# Patient Record
Sex: Female | Born: 1940
Health system: Southern US, Community
[De-identification: ages and names within clinical notes are randomized; demographics above are authoritative.]

## PROBLEM LIST (undated history)

## (undated) DIAGNOSIS — I10 Essential (primary) hypertension: Secondary | ICD-10-CM

## (undated) DIAGNOSIS — D649 Anemia, unspecified: Secondary | ICD-10-CM

## (undated) DIAGNOSIS — K219 Gastro-esophageal reflux disease without esophagitis: Secondary | ICD-10-CM

## (undated) DIAGNOSIS — E785 Hyperlipidemia, unspecified: Secondary | ICD-10-CM

## (undated) DIAGNOSIS — S80819A Abrasion, unspecified lower leg, initial encounter: Secondary | ICD-10-CM

## (undated) DIAGNOSIS — M1990 Unspecified osteoarthritis, unspecified site: Secondary | ICD-10-CM

## (undated) DIAGNOSIS — R0602 Shortness of breath: Secondary | ICD-10-CM

## (undated) HISTORY — PX: TUBAL LIGATION: SHX77

## (undated) HISTORY — PX: TONSILLECTOMY: SUR1361

## (undated) HISTORY — DX: Abrasion, unspecified lower leg, initial encounter: S80.819A

## (undated) HISTORY — PX: CATARACT EXTRACTION: SUR2

## (undated) HISTORY — DX: Essential (primary) hypertension: I10

## (undated) HISTORY — DX: Hyperlipidemia, unspecified: E78.5

## (undated) HISTORY — PX: BIOPSY BREAST: PRO8

## (undated) HISTORY — DX: Anemia, unspecified: D64.9

---

## 2002-11-26 ENCOUNTER — Encounter: Payer: Self-pay | Admitting: Family Medicine

## 2002-11-26 ENCOUNTER — Encounter: Admission: RE | Admit: 2002-11-26 | Discharge: 2002-11-26 | Payer: Self-pay | Admitting: Family Medicine

## 2002-12-07 ENCOUNTER — Other Ambulatory Visit: Admission: RE | Admit: 2002-12-07 | Discharge: 2002-12-07 | Payer: Self-pay | Admitting: Family Medicine

## 2002-12-29 ENCOUNTER — Encounter: Admission: RE | Admit: 2002-12-29 | Discharge: 2002-12-29 | Payer: Self-pay | Admitting: Family Medicine

## 2002-12-29 ENCOUNTER — Encounter: Payer: Self-pay | Admitting: Family Medicine

## 2007-10-09 ENCOUNTER — Other Ambulatory Visit: Admission: RE | Admit: 2007-10-09 | Discharge: 2007-10-09 | Payer: Self-pay | Admitting: Internal Medicine

## 2009-10-31 ENCOUNTER — Encounter: Admission: RE | Admit: 2009-10-31 | Discharge: 2009-10-31 | Payer: Self-pay | Admitting: Internal Medicine

## 2010-02-03 ENCOUNTER — Encounter: Admission: RE | Admit: 2010-02-03 | Discharge: 2010-02-03 | Payer: Self-pay | Admitting: Internal Medicine

## 2011-01-08 ENCOUNTER — Other Ambulatory Visit: Payer: Self-pay | Admitting: Internal Medicine

## 2011-01-08 DIAGNOSIS — Z1231 Encounter for screening mammogram for malignant neoplasm of breast: Secondary | ICD-10-CM

## 2011-02-09 ENCOUNTER — Ambulatory Visit
Admission: RE | Admit: 2011-02-09 | Discharge: 2011-02-09 | Disposition: A | Discharge status: Home / Self Care | Payer: Medicare Other | Source: Ambulatory Visit | Attending: Internal Medicine | Admitting: Internal Medicine

## 2011-02-09 DIAGNOSIS — Z1231 Encounter for screening mammogram for malignant neoplasm of breast: Secondary | ICD-10-CM

## 2011-04-30 DIAGNOSIS — H35369 Drusen (degenerative) of macula, unspecified eye: Secondary | ICD-10-CM | POA: Diagnosis not present

## 2011-04-30 DIAGNOSIS — D3100 Benign neoplasm of unspecified conjunctiva: Secondary | ICD-10-CM | POA: Diagnosis not present

## 2011-04-30 DIAGNOSIS — H524 Presbyopia: Secondary | ICD-10-CM | POA: Diagnosis not present

## 2011-04-30 DIAGNOSIS — H2510 Age-related nuclear cataract, unspecified eye: Secondary | ICD-10-CM | POA: Diagnosis not present

## 2011-05-15 DIAGNOSIS — Z23 Encounter for immunization: Secondary | ICD-10-CM | POA: Diagnosis not present

## 2011-05-15 DIAGNOSIS — R42 Dizziness and giddiness: Secondary | ICD-10-CM | POA: Diagnosis not present

## 2011-11-15 DIAGNOSIS — Z0000 Encounter for general adult medical examination without abnormal findings: Secondary | ICD-10-CM | POA: Diagnosis not present

## 2011-11-15 DIAGNOSIS — E559 Vitamin D deficiency, unspecified: Secondary | ICD-10-CM | POA: Diagnosis not present

## 2011-11-19 DIAGNOSIS — M5380 Other specified dorsopathies, site unspecified: Secondary | ICD-10-CM | POA: Diagnosis not present

## 2011-11-20 DIAGNOSIS — R5383 Other fatigue: Secondary | ICD-10-CM | POA: Diagnosis not present

## 2011-11-20 DIAGNOSIS — E559 Vitamin D deficiency, unspecified: Secondary | ICD-10-CM | POA: Diagnosis not present

## 2011-11-20 DIAGNOSIS — E789 Disorder of lipoprotein metabolism, unspecified: Secondary | ICD-10-CM | POA: Diagnosis not present

## 2011-11-20 DIAGNOSIS — R5381 Other malaise: Secondary | ICD-10-CM | POA: Diagnosis not present

## 2012-01-22 ENCOUNTER — Other Ambulatory Visit: Payer: Self-pay | Admitting: Internal Medicine

## 2012-01-22 DIAGNOSIS — Z1231 Encounter for screening mammogram for malignant neoplasm of breast: Secondary | ICD-10-CM

## 2012-02-19 ENCOUNTER — Ambulatory Visit
Admission: RE | Admit: 2012-02-19 | Discharge: 2012-02-19 | Disposition: A | Discharge status: Home / Self Care | Payer: Medicare Other | Source: Ambulatory Visit | Attending: Internal Medicine | Admitting: Internal Medicine

## 2012-02-19 DIAGNOSIS — Z1231 Encounter for screening mammogram for malignant neoplasm of breast: Secondary | ICD-10-CM

## 2012-02-28 DIAGNOSIS — Z23 Encounter for immunization: Secondary | ICD-10-CM | POA: Diagnosis not present

## 2012-04-29 DIAGNOSIS — Z87898 Personal history of other specified conditions: Secondary | ICD-10-CM | POA: Diagnosis not present

## 2012-04-29 DIAGNOSIS — J069 Acute upper respiratory infection, unspecified: Secondary | ICD-10-CM | POA: Diagnosis not present

## 2012-04-30 DIAGNOSIS — H5200 Hypermetropia, unspecified eye: Secondary | ICD-10-CM | POA: Diagnosis not present

## 2012-04-30 DIAGNOSIS — H43399 Other vitreous opacities, unspecified eye: Secondary | ICD-10-CM | POA: Diagnosis not present

## 2012-04-30 DIAGNOSIS — H524 Presbyopia: Secondary | ICD-10-CM | POA: Diagnosis not present

## 2012-04-30 DIAGNOSIS — H2510 Age-related nuclear cataract, unspecified eye: Secondary | ICD-10-CM | POA: Diagnosis not present

## 2012-07-09 DIAGNOSIS — K625 Hemorrhage of anus and rectum: Secondary | ICD-10-CM | POA: Diagnosis not present

## 2012-11-11 DIAGNOSIS — Z0000 Encounter for general adult medical examination without abnormal findings: Secondary | ICD-10-CM | POA: Diagnosis not present

## 2012-11-11 DIAGNOSIS — E789 Disorder of lipoprotein metabolism, unspecified: Secondary | ICD-10-CM | POA: Diagnosis not present

## 2012-11-11 DIAGNOSIS — R5381 Other malaise: Secondary | ICD-10-CM | POA: Diagnosis not present

## 2012-11-11 DIAGNOSIS — E559 Vitamin D deficiency, unspecified: Secondary | ICD-10-CM | POA: Diagnosis not present

## 2012-11-17 DIAGNOSIS — Z0000 Encounter for general adult medical examination without abnormal findings: Secondary | ICD-10-CM | POA: Diagnosis not present

## 2012-11-17 DIAGNOSIS — E559 Vitamin D deficiency, unspecified: Secondary | ICD-10-CM | POA: Diagnosis not present

## 2012-11-17 DIAGNOSIS — R7309 Other abnormal glucose: Secondary | ICD-10-CM | POA: Diagnosis not present

## 2012-11-17 DIAGNOSIS — K649 Unspecified hemorrhoids: Secondary | ICD-10-CM | POA: Diagnosis not present

## 2013-01-14 ENCOUNTER — Other Ambulatory Visit: Payer: Self-pay

## 2013-01-14 DIAGNOSIS — Z1231 Encounter for screening mammogram for malignant neoplasm of breast: Secondary | ICD-10-CM

## 2013-02-27 ENCOUNTER — Ambulatory Visit: Payer: Medicare Other

## 2013-04-28 ENCOUNTER — Ambulatory Visit
Admission: RE | Admit: 2013-04-28 | Discharge: 2013-04-28 | Disposition: A | Discharge status: Home / Self Care | Payer: Medicare Other | Source: Ambulatory Visit

## 2013-04-28 DIAGNOSIS — Z1231 Encounter for screening mammogram for malignant neoplasm of breast: Secondary | ICD-10-CM | POA: Diagnosis not present

## 2013-05-14 DIAGNOSIS — E559 Vitamin D deficiency, unspecified: Secondary | ICD-10-CM | POA: Diagnosis not present

## 2013-05-14 DIAGNOSIS — R7309 Other abnormal glucose: Secondary | ICD-10-CM | POA: Diagnosis not present

## 2013-05-14 DIAGNOSIS — E7800 Pure hypercholesterolemia, unspecified: Secondary | ICD-10-CM | POA: Diagnosis not present

## 2013-05-20 DIAGNOSIS — E785 Hyperlipidemia, unspecified: Secondary | ICD-10-CM | POA: Diagnosis not present

## 2013-05-20 DIAGNOSIS — R7989 Other specified abnormal findings of blood chemistry: Secondary | ICD-10-CM | POA: Diagnosis not present

## 2013-05-20 DIAGNOSIS — R7309 Other abnormal glucose: Secondary | ICD-10-CM | POA: Diagnosis not present

## 2013-05-20 DIAGNOSIS — E559 Vitamin D deficiency, unspecified: Secondary | ICD-10-CM | POA: Diagnosis not present

## 2013-08-20 DIAGNOSIS — H35369 Drusen (degenerative) of macula, unspecified eye: Secondary | ICD-10-CM | POA: Diagnosis not present

## 2013-08-20 DIAGNOSIS — H2510 Age-related nuclear cataract, unspecified eye: Secondary | ICD-10-CM | POA: Diagnosis not present

## 2013-08-20 DIAGNOSIS — D3100 Benign neoplasm of unspecified conjunctiva: Secondary | ICD-10-CM | POA: Diagnosis not present

## 2013-08-20 DIAGNOSIS — H524 Presbyopia: Secondary | ICD-10-CM | POA: Diagnosis not present

## 2013-09-24 DIAGNOSIS — M76899 Other specified enthesopathies of unspecified lower limb, excluding foot: Secondary | ICD-10-CM | POA: Diagnosis not present

## 2013-09-28 DIAGNOSIS — M47817 Spondylosis without myelopathy or radiculopathy, lumbosacral region: Secondary | ICD-10-CM | POA: Diagnosis not present

## 2013-09-28 DIAGNOSIS — M545 Low back pain, unspecified: Secondary | ICD-10-CM | POA: Diagnosis not present

## 2013-09-28 DIAGNOSIS — M4310 Spondylolisthesis, site unspecified: Secondary | ICD-10-CM | POA: Diagnosis not present

## 2013-09-28 DIAGNOSIS — M549 Dorsalgia, unspecified: Secondary | ICD-10-CM | POA: Diagnosis not present

## 2013-10-06 ENCOUNTER — Other Ambulatory Visit: Payer: Self-pay | Admitting: Internal Medicine

## 2013-10-06 DIAGNOSIS — M549 Dorsalgia, unspecified: Secondary | ICD-10-CM

## 2013-10-15 ENCOUNTER — Ambulatory Visit
Admission: RE | Admit: 2013-10-15 | Discharge: 2013-10-15 | Disposition: A | Discharge status: Home / Self Care | Payer: Medicare Other | Source: Ambulatory Visit | Attending: Internal Medicine | Admitting: Internal Medicine

## 2013-10-15 DIAGNOSIS — M48061 Spinal stenosis, lumbar region without neurogenic claudication: Secondary | ICD-10-CM | POA: Diagnosis not present

## 2013-10-15 DIAGNOSIS — M5137 Other intervertebral disc degeneration, lumbosacral region: Secondary | ICD-10-CM | POA: Diagnosis not present

## 2013-10-15 DIAGNOSIS — M4310 Spondylolisthesis, site unspecified: Secondary | ICD-10-CM | POA: Diagnosis not present

## 2013-10-15 DIAGNOSIS — M549 Dorsalgia, unspecified: Secondary | ICD-10-CM

## 2013-10-15 DIAGNOSIS — M47817 Spondylosis without myelopathy or radiculopathy, lumbosacral region: Secondary | ICD-10-CM | POA: Diagnosis not present

## 2013-10-19 ENCOUNTER — Other Ambulatory Visit: Payer: Self-pay | Admitting: Internal Medicine

## 2013-10-19 DIAGNOSIS — R9389 Abnormal findings on diagnostic imaging of other specified body structures: Secondary | ICD-10-CM

## 2013-10-20 ENCOUNTER — Ambulatory Visit
Admission: RE | Admit: 2013-10-20 | Discharge: 2013-10-20 | Disposition: A | Discharge status: Home / Self Care | Payer: Medicare Other | Source: Ambulatory Visit | Attending: Internal Medicine | Admitting: Internal Medicine

## 2013-10-20 DIAGNOSIS — R9389 Abnormal findings on diagnostic imaging of other specified body structures: Secondary | ICD-10-CM

## 2013-10-20 DIAGNOSIS — N859 Noninflammatory disorder of uterus, unspecified: Secondary | ICD-10-CM | POA: Diagnosis not present

## 2013-11-02 ENCOUNTER — Ambulatory Visit (INDEPENDENT_AMBULATORY_CARE_PROVIDER_SITE_OTHER): Payer: Medicare Other | Admitting: Obstetrics & Gynecology

## 2013-11-02 ENCOUNTER — Encounter: Payer: Self-pay | Admitting: Obstetrics & Gynecology

## 2013-11-02 VITALS — BP 140/82 | HR 64 | Resp 16 | Ht 66.0 in | Wt 258.4 lb

## 2013-11-02 DIAGNOSIS — R9389 Abnormal findings on diagnostic imaging of other specified body structures: Secondary | ICD-10-CM

## 2013-11-02 DIAGNOSIS — Z124 Encounter for screening for malignant neoplasm of cervix: Secondary | ICD-10-CM | POA: Diagnosis not present

## 2013-11-02 DIAGNOSIS — D261 Other benign neoplasm of corpus uteri: Secondary | ICD-10-CM | POA: Diagnosis not present

## 2013-11-02 NOTE — Progress Notes (Signed)
Sonohysterogram U/S scheduled and patient aware/agreeable to time.  Patient verbalized understanding of the U/S appointment cancellation policy. Advised will need to cancel within 72 business hours (3 business days) or will have $150.00 no show fee placed to account.Given printed appointment instructions and [employer] pamphlet on ultrasounds.

## 2013-11-02 NOTE — Progress Notes (Signed)
Patient ID: Christine Munoz, female   DOB: 1940-10-03, 73 y.o.   MRN: [id number]  73 y.o. G1P1 SingleAfrican AmericanF here for new patient visit.  Referred from Dr. Minna Antis.  Pt having knee issues and low back issues.  Had MRI of spine done 10/15/13 showing abnormal appearing endometrium.  PUS done 10/20/13 showing 2.5cm thickened endometrium.  Pt denied vaginal bleeding or pelvic pain.  She has not seen a gynecologist in several years.  Unsure of last Pap smear.  Very nervous today.    No LMP recorded. Patient is postmenopausal.          Sexually active: No.  The current method of family planning is tubal ligation.    Exercising: No.  due to current low back pain Smoker:  no  Health Maintenance: Pap:  unsure History of abnormal Pap:  no MMG:  1/15   reports that she has quit smoking. She has never used smokeless tobacco. She reports that she drinks alcohol. She reports that she does not use illicit drugs.  Past Medical History  Diagnosis Date  . Hyperlipidemia     borderline  . Anemia   . Leg abrasion     had to go to the wound center-? diag    Past Surgical History  Procedure Laterality Date  . Tubal ligation    . Tonsillectomy      Current Outpatient Prescriptions  Medication Sig Dispense Refill  . aspirin 81 MG tablet Take 81 mg by mouth daily.      Marland Kitchen CALCIUM PO Take by mouth daily.      . Cholecalciferol (VITAMIN D PO) Take 4,000 Int'l Units by mouth.      . Omega-3 Fatty Acids (FISH OIL PO) Take by mouth daily.      . traMADol (ULTRAM) 50 MG tablet Take by mouth 2 (two) times daily.       No current facility-administered medications for this visit.    Family History  Problem Relation Age of Onset  . Liver cancer Sister   . Brain cancer Father     tumor  . Arthritis Mother   . Colon cancer Maternal Uncle   . Leukemia Maternal Uncle     ROS:  Pertinent items are noted in HPI.  Otherwise, a comprehensive ROS was negative.  Exam:   BP 140/82  Pulse 64  Resp 16   Ht 5\' 6"  (1.676 m)  Wt 258 lb 6.4 oz (117.209 kg)  BMI 41.73 kg/m2   Height: 5\' 6"  (167.6 cm)  Ht Readings from Last 3 Encounters:  11/02/13 5\' 6"  (1.676 m)    General appearance: alert, cooperative and appears stated age Head: Normocephalic, without obvious abnormality, atraumatic Lungs: clear bilaterally Breasts: normal appearance, no masses or tenderness Heart: regular rate and rhythm Abdomen: soft, non-tender; bowel sounds normal; no masses,  no organomegaly Extremities: extremities normal, atraumatic, no cyanosis or edema Skin: Skin color, texture, turgor normal. No rashes or lesions Lymph nodes: Cervical, supraclavicular, and axillary nodes normal. No abnormal inguinal nodes palpated Neurologic: Grossly normal   Pelvic: External genitalia:  no lesions              Urethra:  normal appearing urethra with no masses, tenderness or lesions              Bartholins and Skenes: normal                 Vagina: normal appearing vagina with normal color and discharge, no lesions  Cervix: no lesions              Pap taken: Yes.   Bimanual Exam:  Uterus:  normal size, contour, position, consistency, mobility, non-tender              Adnexa: normal adnexa and no mass, fullness, tenderness               Rectovaginal: Confirms               Anus:  normal sphincter tone, no lesions  Endometrial biopsy recommended.  Discussed with patient.  Verbal and written consent obtained.   Procedure:  Speculum placed.  Cervix visualized and cleansed with betadine prep.  A single toothed tenaculum was applied to the anterior lip of the cervix.  Dilation of cervix was necessary with milex dilator.  Endometrial pipelle was advanced through the cervix into the endometrial cavity without difficulty.  Pipelle passed to 7.5cm.  Suction applied and pipelle removed with scant tissue specimen.  Second pass was performed with more tissue present.  Still, tissue appears atrohpic to visual inspection.   Tenculum removed.  No bleeding noted.  Patient tolerated procedure well.  All instruments removed. 78588 A:  Thickened endometrium Obese AAF Elevated lipids Left leg pain, improved after steroid taper  P:   Endometrial biopsy pending.  Doubtful there is anything abnormal here.  Probably polyp causing finding.  Will proceed with sonohysterography.  If polyp or fibroid present, will recommend resection.  Pt will return for this ultrasound later this week.  An After Visit Summary was printed and given to the patient.

## 2013-11-02 NOTE — Patient Instructions (Signed)

## 2013-11-04 LAB — IPS PAP SMEAR ONLY

## 2013-11-05 ENCOUNTER — Ambulatory Visit (INDEPENDENT_AMBULATORY_CARE_PROVIDER_SITE_OTHER): Payer: Medicare Other

## 2013-11-05 ENCOUNTER — Other Ambulatory Visit: Payer: Self-pay | Admitting: Obstetrics & Gynecology

## 2013-11-05 ENCOUNTER — Ambulatory Visit (INDEPENDENT_AMBULATORY_CARE_PROVIDER_SITE_OTHER): Payer: Medicare Other | Admitting: Obstetrics & Gynecology

## 2013-11-05 VITALS — BP 140/86 | Resp 20 | Ht 66.0 in | Wt 260.0 lb

## 2013-11-05 DIAGNOSIS — N8500 Endometrial hyperplasia, unspecified: Secondary | ICD-10-CM

## 2013-11-05 DIAGNOSIS — R9389 Abnormal findings on diagnostic imaging of other specified body structures: Secondary | ICD-10-CM

## 2013-11-05 DIAGNOSIS — N840 Polyp of corpus uteri: Secondary | ICD-10-CM | POA: Diagnosis not present

## 2013-11-05 NOTE — Progress Notes (Signed)
73 y.o.Singlefemale here for a pelvic ultrasound due to thickened endometrium noted on MRI done 10/15/13.  Pt has not had any vaginal bleeding to date except for a small amount of spotting after I performed an endometrial biopsy on 11/02/13.  Biopsy result was negative.  Pt here for [facility] as I feel she probably has a polyp.  No LMP recorded. Patient is postmenopausal.  Sexually active:  no  Contraception: PMP  Technique:  Both transabdominal and transvaginal ultrasound examinations of the pelvis were performed. Transabdominal technique was performed for global imaging of the pelvis including uterus, ovaries, adnexal regions, and pelvic cul-de-sac.  It was necessary to proceed with endovaginal exam following the abdominal ultrasound transabdominal exam to visualize the endometrium and adnexa.  Color and duplex Doppler ultrasound was utilized to evaluate blood flow to the ovaries.   FINDINGS: UTERUS: 7.6 x 4.9 x 3.5cm EMS: 25mm ADNEXA:   Left ovary 1.4 x 1.2 x 0.9cm   Right ovary 1.8 x 1.3 x 0.9cm CUL DE SAC: no free fluid in PCDS  SHSG:  After obtaining appropriate verbal consent from patient, the cervix was visualized using a speculum, and prepped with betadine.  A tenaculum  was applied to the cervix.  Dilation of the cervix was not necessary. The catheter was passed into the uterus and sterile saline introduced, with the following findings:  Large polyp 44 x 20cm noted.  All instruments removed.  Pt tolerated procedure well.  Pt and I reviewed images and discussed recommendations.  She needs the polyp removed due to size.  Procedure discussed with patient.  Recovery and pain management discussed.  Risks discussed including but not limited to bleeding, rare risk of transfusion, infection, 1% risk of uterine perforation with risks of fluid deficit causing cardiac arrythmia, cerebral swelling and/or need to stop procedure early.  Fluid emboli and rare risk of death discussed.  DVT/PE, rare risk of risk  of bowel/bladder/ureteral/vascular injury.  Patient aware if pathology abnormal she may need additional treatment.  All questions answered.  She is ready to schedule.  Assessment:  Large endometrial polyp  Plan:  Hysteroscopy with polyp resection, D&C planned.  Will proceed with scheduling.  Pt does know to stop her aspirin 10 days before procedure.  Surgical orders placed.  ~30 minutes spent with patient >50% of time was in face to face discussion of above.

## 2013-11-06 ENCOUNTER — Encounter: Payer: Self-pay | Admitting: Obstetrics & Gynecology

## 2013-11-06 DIAGNOSIS — N840 Polyp of corpus uteri: Secondary | ICD-10-CM | POA: Insufficient documentation

## 2013-11-06 DIAGNOSIS — R9389 Abnormal findings on diagnostic imaging of other specified body structures: Secondary | ICD-10-CM | POA: Insufficient documentation

## 2013-11-24 ENCOUNTER — Encounter (HOSPITAL_COMMUNITY): Payer: Self-pay | Admitting: Pharmacist

## 2013-12-03 ENCOUNTER — Encounter (HOSPITAL_COMMUNITY): Payer: Self-pay

## 2013-12-03 ENCOUNTER — Encounter (HOSPITAL_COMMUNITY)
Admission: RE | Admit: 2013-12-03 | Discharge: 2013-12-03 | Disposition: A | Discharge status: Home / Self Care | Payer: Medicare Other | Source: Ambulatory Visit | Attending: Obstetrics & Gynecology | Admitting: Obstetrics & Gynecology

## 2013-12-03 DIAGNOSIS — Z01812 Encounter for preprocedural laboratory examination: Secondary | ICD-10-CM | POA: Diagnosis not present

## 2013-12-03 HISTORY — DX: Unspecified osteoarthritis, unspecified site: M19.90

## 2013-12-03 HISTORY — DX: Shortness of breath: R06.02

## 2013-12-03 HISTORY — DX: Gastro-esophageal reflux disease without esophagitis: K21.9

## 2013-12-03 LAB — CBC
HCT: 40.5 % (ref 36.0–46.0)
Hemoglobin: 13.5 g/dL (ref 12.0–15.0)
MCH: 29.3 pg (ref 26.0–34.0)
MCHC: 33.3 g/dL (ref 30.0–36.0)
MCV: 88 fL (ref 78.0–100.0)
Platelets: 216 10*3/uL (ref 150–400)
RBC: 4.6 MIL/uL (ref 3.87–5.11)
RDW: 14.2 % (ref 11.5–15.5)
WBC: 8.2 10*3/uL (ref 4.0–10.5)

## 2013-12-03 NOTE — Patient Instructions (Signed)
Your procedure is scheduled on:12/07/13  Enter through the Main Entrance at :0815 am Pick up desk phone and dial [phone] and inform us of your arrival.  Please call [phone] if you have any problems the morning of surgery.  Remember: Do not eat food or drink liquids, including water, after midnight:Sunday    You may brush your teeth the morning of surgery.  Take these meds the morning of surgery with a sip of water:none  DO NOT wear jewelry, eye make-up, lipstick,body lotion, or dark fingernail polish.  (Polished toes are ok) You may wear deodorant.  If you are to be admitted after surgery, leave suitcase in car until your room has been assigned. Patients discharged on the day of surgery will not be allowed to drive home. Wear loose fitting, comfortable clothes for your ride home.

## 2013-12-04 ENCOUNTER — Encounter (HOSPITAL_COMMUNITY): Payer: Self-pay | Admitting: Anesthesiology

## 2013-12-07 ENCOUNTER — Ambulatory Visit (HOSPITAL_COMMUNITY)
Admission: RE | Admit: 2013-12-07 | Discharge: 2013-12-07 | Disposition: A | Discharge status: Home / Self Care | Payer: Medicare Other | Source: Ambulatory Visit | Attending: Obstetrics & Gynecology | Admitting: Obstetrics & Gynecology

## 2013-12-07 ENCOUNTER — Ambulatory Visit (HOSPITAL_COMMUNITY): Payer: Medicare Other | Admitting: Anesthesiology

## 2013-12-07 ENCOUNTER — Encounter (HOSPITAL_COMMUNITY): Payer: Medicare Other | Admitting: Anesthesiology

## 2013-12-07 ENCOUNTER — Encounter (HOSPITAL_COMMUNITY)
Admission: RE | Disposition: A | Discharge status: Home / Self Care | Payer: Self-pay | Source: Ambulatory Visit | Attending: Obstetrics & Gynecology

## 2013-12-07 ENCOUNTER — Encounter (HOSPITAL_COMMUNITY): Payer: Self-pay | Admitting: General Surgery

## 2013-12-07 DIAGNOSIS — N950 Postmenopausal bleeding: Secondary | ICD-10-CM | POA: Insufficient documentation

## 2013-12-07 DIAGNOSIS — Z87891 Personal history of nicotine dependence: Secondary | ICD-10-CM | POA: Insufficient documentation

## 2013-12-07 DIAGNOSIS — K219 Gastro-esophageal reflux disease without esophagitis: Secondary | ICD-10-CM | POA: Diagnosis not present

## 2013-12-07 DIAGNOSIS — N840 Polyp of corpus uteri: Secondary | ICD-10-CM | POA: Diagnosis not present

## 2013-12-07 DIAGNOSIS — Z808 Family history of malignant neoplasm of other organs or systems: Secondary | ICD-10-CM | POA: Diagnosis not present

## 2013-12-07 DIAGNOSIS — Z8261 Family history of arthritis: Secondary | ICD-10-CM | POA: Insufficient documentation

## 2013-12-07 DIAGNOSIS — R0602 Shortness of breath: Secondary | ICD-10-CM | POA: Insufficient documentation

## 2013-12-07 DIAGNOSIS — M129 Arthropathy, unspecified: Secondary | ICD-10-CM | POA: Diagnosis not present

## 2013-12-07 DIAGNOSIS — Z800 Family history of malignant neoplasm of digestive organs: Secondary | ICD-10-CM | POA: Insufficient documentation

## 2013-12-07 DIAGNOSIS — E785 Hyperlipidemia, unspecified: Secondary | ICD-10-CM | POA: Insufficient documentation

## 2013-12-07 HISTORY — PX: DILATATION & CURRETTAGE/HYSTEROSCOPY WITH RESECTOCOPE: SHX5572

## 2013-12-07 SURGERY — DILATATION & CURETTAGE/HYSTEROSCOPY WITH RESECTOCOPE
Anesthesia: Choice

## 2013-12-07 MED ORDER — SILVER NITRATE-POT NITRATE 75-25 % EX MISC
CUTANEOUS | Status: AC
  Filled 2013-12-07: qty 1

## 2013-12-07 MED ORDER — SCOPOLAMINE 1 MG/3DAYS TD PT72: 1.0000 | MEDICATED_PATCH | TRANSDERMAL | Status: DC

## 2013-12-07 MED ORDER — ONDANSETRON HCL 4 MG/2ML IJ SOLN
INTRAMUSCULAR | Status: DC | PRN
  Administered 2013-12-07: 4 mg via INTRAVENOUS

## 2013-12-07 MED ORDER — ACETAMINOPHEN 325 MG PO TABS: 650.0000 mg | ORAL_TABLET | ORAL | Status: DC | PRN

## 2013-12-07 MED ORDER — LIDOCAINE-EPINEPHRINE 1 %-1:100000 IJ SOLN
INTRAMUSCULAR | Status: AC
  Filled 2013-12-07: qty 1

## 2013-12-07 MED ORDER — MIDAZOLAM HCL 2 MG/2ML IJ SOLN
INTRAMUSCULAR | Status: AC
  Filled 2013-12-07: qty 2

## 2013-12-07 MED ORDER — HYDROCODONE-ACETAMINOPHEN 5-300 MG PO TABS: 1.0000 | ORAL_TABLET | Freq: Four times a day (QID) | ORAL | Status: DC

## 2013-12-07 MED ORDER — DEXAMETHASONE SODIUM PHOSPHATE 4 MG/ML IJ SOLN
INTRAMUSCULAR | Status: DC | PRN
  Administered 2013-12-07: 4 mg via INTRAVENOUS

## 2013-12-07 MED ORDER — PROPOFOL 10 MG/ML IV EMUL
INTRAVENOUS | Status: AC
  Filled 2013-12-07: qty 20

## 2013-12-07 MED ORDER — LACTATED RINGERS IV SOLN
INTRAVENOUS | Status: DC
  Administered 2013-12-07 (×2): via INTRAVENOUS

## 2013-12-07 MED ORDER — HYDROMORPHONE HCL PF 1 MG/ML IJ SOLN
INTRAMUSCULAR | Status: AC
  Filled 2013-12-07: qty 1

## 2013-12-07 MED ORDER — FENTANYL CITRATE 0.05 MG/ML IJ SOLN
INTRAMUSCULAR | Status: AC
  Filled 2013-12-07: qty 2

## 2013-12-07 MED ORDER — FENTANYL CITRATE 0.05 MG/ML IJ SOLN
INTRAMUSCULAR | Status: DC | PRN
  Administered 2013-12-07: 50 ug via INTRAVENOUS
  Administered 2013-12-07 (×2): 25 ug via INTRAVENOUS
  Administered 2013-12-07 (×2): 50 ug via INTRAVENOUS

## 2013-12-07 MED ORDER — GLYCINE 1.5 % IR SOLN
Status: DC | PRN
  Administered 2013-12-07: 3000 mL

## 2013-12-07 MED ORDER — KETOROLAC TROMETHAMINE 30 MG/ML IJ SOLN
INTRAMUSCULAR | Status: AC
Start: 2013-12-07 — End: 2013-12-07
  Administered 2013-12-07: 15 mg
  Filled 2013-12-07: qty 1

## 2013-12-07 MED ORDER — TRAMADOL HCL 50 MG PO TABS: 50.0000 mg | ORAL_TABLET | Freq: Two times a day (BID) | ORAL | Status: DC

## 2013-12-07 MED ORDER — PROPOFOL INFUSION 10 MG/ML OPTIME
INTRAVENOUS | Status: DC | PRN
  Administered 2013-12-07: 150 mL via INTRAVENOUS
  Administered 2013-12-07: 20 mL via INTRAVENOUS
  Administered 2013-12-07: 30 mL via INTRAVENOUS

## 2013-12-07 MED ORDER — CEFAZOLIN SODIUM-DEXTROSE 2-3 GM-% IV SOLR
INTRAVENOUS | Status: AC
  Administered 2013-12-07: 2 g via INTRAVENOUS
  Filled 2013-12-07: qty 50

## 2013-12-07 MED ORDER — KETOROLAC TROMETHAMINE 15 MG/ML IJ SOLN
15.0000 mg | Freq: Four times a day (QID) | INTRAMUSCULAR | Status: DC
  Filled 2013-12-07 (×6): qty 1

## 2013-12-07 MED ORDER — SCOPOLAMINE 1 MG/3DAYS TD PT72
MEDICATED_PATCH | TRANSDERMAL | Status: AC
  Filled 2013-12-07: qty 1

## 2013-12-07 MED ORDER — LIDOCAINE-EPINEPHRINE 1 %-1:100000 IJ SOLN
INTRAMUSCULAR | Status: DC | PRN
  Administered 2013-12-07: 10 mL

## 2013-12-07 MED ORDER — DEXAMETHASONE SODIUM PHOSPHATE 10 MG/ML IJ SOLN
INTRAMUSCULAR | Status: AC
  Filled 2013-12-07: qty 1

## 2013-12-07 MED ORDER — MIDAZOLAM HCL 2 MG/2ML IJ SOLN
INTRAMUSCULAR | Status: DC | PRN
  Administered 2013-12-07 (×2): 1 mg via INTRAVENOUS

## 2013-12-07 MED ORDER — LIDOCAINE HCL (CARDIAC) 20 MG/ML IV SOLN
INTRAVENOUS | Status: AC
  Filled 2013-12-07: qty 5

## 2013-12-07 MED ORDER — ONDANSETRON HCL 4 MG/2ML IJ SOLN
INTRAMUSCULAR | Status: AC
  Filled 2013-12-07: qty 2

## 2013-12-07 MED ORDER — CEFAZOLIN SODIUM-DEXTROSE 2-3 GM-% IV SOLR: 2.0000 g | INTRAVENOUS | Status: DC

## 2013-12-07 MED ORDER — ACETAMINOPHEN 650 MG RE SUPP
650.0000 mg | RECTAL | Status: DC | PRN
  Filled 2013-12-07: qty 1

## 2013-12-07 MED ORDER — LIDOCAINE HCL (CARDIAC) 20 MG/ML IV SOLN
INTRAVENOUS | Status: DC | PRN
  Administered 2013-12-07: 80 mg via INTRAVENOUS

## 2013-12-07 SURGICAL SUPPLY — 21 items
CANISTER SUCT 3000ML (MISCELLANEOUS) ×2 IMPLANT
CATH ROBINSON RED A/P 16FR (CATHETERS) ×2 IMPLANT
CLOTH BEACON ORANGE TIMEOUT ST (SAFETY) ×2 IMPLANT
CONTAINER PREFILL 10% NBF 60ML (FORM) ×4 IMPLANT
DILATOR CANAL MILEX (MISCELLANEOUS) IMPLANT
DRAPE HYSTEROSCOPY (DRAPE) ×2 IMPLANT
DRSG TELFA 3X8 NADH (GAUZE/BANDAGES/DRESSINGS) ×2 IMPLANT
ELECT REM PT RETURN 9FT ADLT (ELECTROSURGICAL)
ELECTRODE REM PT RTRN 9FT ADLT (ELECTROSURGICAL) IMPLANT
GLOVE BIOGEL PI IND STRL 7.0 (GLOVE) ×1 IMPLANT
GLOVE BIOGEL PI INDICATOR 7.0 (GLOVE) ×1
GLOVE ECLIPSE 6.5 STRL STRAW (GLOVE) ×4 IMPLANT
GOWN STRL REUS W/TWL LRG LVL3 (GOWN DISPOSABLE) ×4 IMPLANT
LOOP ANGLED CUTTING 22FR (CUTTING LOOP) IMPLANT
PACK VAGINAL MINOR WOMEN LF (CUSTOM PROCEDURE TRAY) ×2 IMPLANT
PAD DRESSING TELFA 3X8 NADH (GAUZE/BANDAGES/DRESSINGS) ×1 IMPLANT
PAD OB MATERNITY 4.3X12.25 (PERSONAL CARE ITEMS) ×2 IMPLANT
SET TUBING HYSTEROSCOPY 2 NDL (TUBING) IMPLANT
TOWEL OR 17X24 6PK STRL BLUE (TOWEL DISPOSABLE) ×4 IMPLANT
TUBE HYSTEROSCOPY W Y-CONNECT (TUBING) IMPLANT
WATER STERILE IRR 1000ML POUR (IV SOLUTION) ×2 IMPLANT

## 2013-12-07 NOTE — Anesthesia Preprocedure Evaluation (Signed)
Anesthesia Evaluation  Patient identified by MRN, date of birth, ID band Patient awake    Reviewed: Allergy & Precautions, H&P , Patient's Chart, lab work & pertinent test results, reviewed documented beta blocker date and time   Airway Mallampati: II TM Distance: >3 FB Neck ROM: full    Dental no notable dental hx.    Pulmonary former smoker,  breath sounds clear to auscultation  Pulmonary exam normal       Cardiovascular Rhythm:regular Rate:Normal     Neuro/Psych    GI/Hepatic   Endo/Other    Renal/GU      Musculoskeletal   Abdominal   Peds  Hematology   Anesthesia Other Findings   Reproductive/Obstetrics                           Anesthesia Physical Anesthesia Plan  ASA: III  Anesthesia Plan:    Post-op Pain Management:    Induction: Intravenous  Airway Management Planned: LMA  Additional Equipment:   Intra-op Plan:   Post-operative Plan:   Informed Consent: I have reviewed the patients History and Physical, chart, labs and discussed the procedure including the risks, benefits and alternatives for the proposed anesthesia with the patient or authorized representative who has indicated his/her understanding and acceptance.   Dental Advisory Given and Dental advisory given  Plan Discussed with: CRNA and Surgeon  Anesthesia Plan Comments: (Discussed GA with LMA, possible sore throat, potential need to switch to ETT, N/V, pulmonary aspiration. Questions answered. )        Anesthesia Quick Evaluation

## 2013-12-07 NOTE — H&P (Signed)
Christine Munoz is an 73 y.o. female. G1P1 AAF here for hysteroscopy and polyp resection due to large 4.4cm polyp noted on [facility] after she was initially evaluated for PMP bleeding.  Risks and benefits have been explained to patient, in office, and she is here and ready to proceed.  Pertinent Gynecological History: Menses: post-menopausal Bleeding: none Contraception: post menopausal status DES exposure: denies Blood transfusions: none Sexually transmitted diseases: no past history Previous GYN Procedures: none  Last mammogram: normal Date: 1/15 Last pap: normal Date: 7/15 OB History: G1, P1   Menstrual History: No LMP recorded. Patient is postmenopausal.    Past Medical History  Diagnosis Date  . Hyperlipidemia     borderline  . Leg abrasion     had to go to the wound center-? diag  . Arthritis   . Anemia     h/o anemia  . Shortness of breath     due to her weight- per pt  . GERD (gastroesophageal reflux disease)     no meds    Past Surgical History  Procedure Laterality Date  . Tubal ligation    . Tonsillectomy      Family History  Problem Relation Age of Onset  . Liver cancer Sister   . Brain cancer Father     tumor  . Arthritis Mother   . Colon cancer Maternal Uncle   . Leukemia Maternal Uncle     Social History:  reports that she has quit smoking. She has never used smokeless tobacco. She reports that she drinks alcohol. She reports that she does not use illicit drugs.  Allergies: No Known Allergies  Prescriptions prior to admission  Medication Sig Dispense Refill  . acetaminophen (TYLENOL) 500 MG tablet Take 1,000 mg by mouth every 6 (six) hours as needed for moderate pain.      Marland Kitchen aspirin 81 MG tablet Take 81 mg by mouth daily.      Marland Kitchen CALCIUM PO Take by mouth daily.      . Cholecalciferol (VITAMIN D PO) Take 4,000 Int'l Units by mouth.      . Omega-3 Fatty Acids (FISH OIL PO) Take by mouth daily.      . traMADol (ULTRAM) 50 MG tablet Take by mouth 2  (two) times daily.        Review of Systems  All other systems reviewed and are negative.   Blood pressure 141/91, pulse 70, temperature 97.9 F (36.6 C), temperature source Oral, resp. rate 20, SpO2 100.00%. Physical Exam  Constitutional: She is oriented to person, place, and time. She appears well-developed and well-nourished.  Cardiovascular: Normal rate and regular rhythm.   Respiratory: Effort normal and breath sounds normal.  Neurological: She is alert and oriented to person, place, and time.  Skin: Skin is warm and dry.  Psychiatric: She has a normal mood and affect.    No results found for this or any previous visit (from the past 24 hour(s)).  No results found.  Assessment/Plan: 73 yo G1P1 AAF here for hysteroscopic polyp resection, D&C due to large endometrial polyp and PMP bleeding.    Christine Munoz 12/07/2013, 9:10 AM

## 2013-12-07 NOTE — Transfer of Care (Signed)
Immediate Anesthesia Transfer of Care Note  Patient: Christine Munoz  Procedure(s) Performed: Procedure(s): DILATATION & CURETTAGE/HYSTEROSCOPY WITH RESECTOCOPE (N/A)  Patient Location: PACU  Anesthesia Type:General  Level of Consciousness: awake, alert  and oriented  Airway & Oxygen Therapy: Patient Spontanous Breathing and Patient connected to nasal cannula oxygen  Post-op Assessment: Report given to PACU RN and Post -op Vital signs reviewed and stable  Post vital signs: Reviewed and stable  Complications: No apparent anesthesia complications

## 2013-12-07 NOTE — Discharge Instructions (Signed)
DO NOT TAKE IBUPROFEN (ADVIL, ALEVE OR MOTRIN) TILL AFTER 5:30 PM!  Post-surgical Instructions, Outpatient Surgery  You may expect to feel dizzy, weak, and drowsy for as long as 24 hours after receiving the medicine that made you sleep (anesthetic). For the first 24 hours after your surgery:    Do not drive a car, ride a bicycle, participate in physical activities, or take public transportation until you are done taking narcotic pain medicines or as directed by Dr. Sabra Heck.   Do not drink alcohol or take tranquilizers.   Do not take medicine that has not been prescribed by your physicians.   Do not sign important papers or make important decisions while on narcotic pain medicines.   Have a responsible person with you.   PAIN MANAGEMENT  Motrin 800mg .  (This is the same as 4-200mg  over the counter tablets of Motrin or ibuprofen.)  You may take this every eight hours or as needed for cramping.    Vicodin 5/300mg .  For more severe pain, take one or two tablets every four to six hours as needed for pain control.  (Remember that narcotic pain medications increase your risk of constipation.  If this becomes a problem, you may take an over the counter stool softener like Colace 100mg  up to four times a day.)  Use the Vicodin in place of your Tramadol.  You can restart the Tramadol once you do not need the Vicodin.  DO'S AND DON'T'S  Do not take a tub bath for one week.  You may shower on the first day after your surgery  Do not do any heavy lifting for one to two weeks.  This increases the chance of bleeding.  Do move around as you feel able.  Stairs are fine.  You may begin to exercise again as you feel able.  Do not lift any weights for two weeks.  Do not put anything in the vagina for two weeks--no tampons, intercourse, or douching.    REGULAR MEDIATIONS/VITAMINS:  You may restart all of your regular medications as prescribed.  You may restart all of your vitamins as you normally  take them.    PLEASE CALL OR SEEK MEDICAL CARE IF:  You have persistent nausea and vomiting.   You have trouble eating or drinking.   You have an oral temperature above 100.5.   You have constipation that is not helped by adjusting diet or increasing fluid intake. Pain medicines are a common cause of constipation.   You have heavy vaginal bleeding  You have redness or drainage from your incision(s) or there is increasing pain or tenderness near or in the surgical site.

## 2013-12-07 NOTE — Brief Op Note (Signed)
12/07/2013  11:07 AM  PATIENT:  Christine Munoz  73 y.o. female  PRE-OPERATIVE DIAGNOSIS:  Endometrial polyp, PMP bleeding  POST-OPERATIVE DIAGNOSIS:  Endometrial polyp, PMP bleeding  PROCEDURE:  Procedure(s): DILATATION & CURETTAGE/HYSTEROSCOPY WITH RESECTOCOPE  SURGEON:  Melven Stockard SUZANNE  ASSISTANTS: OR staff   ANESTHESIA:   general  ESTIMATED BLOOD LOSS: 10cc  BLOOD ADMINISTERED:none   FLUIDS: 1000cc LR  UOP: 75 cc clear, drained with I&O cath at beginning of procedure  SPECIMEN:  Endometrial polyp and curettings  DISPOSITION OF SPECIMEN:  PATHOLOGY  FINDINGS: large polyp, this endometrium, polyp was friable and came out in many pieces  DESCRIPTION OF OPERATION: Patient was taken to the operating room.  She is placed in the supine position. SCDs were on her lower extremities and functioning properly. General anesthesia with an LMA was administered without difficulty. Dr. Lyndle Herrlich oversaw case.  Legs were then placed in the [place] in the low lithotomy position. The legs were lifted to the high lithotomy position and the Betadine prep was used on the inner thighs perineum and vagina x3. Patient was draped in a normal standard fashion. An in and out catheterization with a red rubber Foley catheter was performed. Approximately 75cc of clear urine was noted. A bivalve speculum was placed the vagina. The anterior lip of the cervix was grasped with single-tooth tenaculum.  A paracervical block of 1% lidocaine mixed one-to-one with epinephrine (1:100,000 units).  10 cc was used total. The cervix is dilated up to #21 [facility] dilators. The endometrial cavity sounded to 8 cm.   A 2.9 millimeter diagnostic hysteroscope was obtained. 1.5% glycine was used as a hysteroscopic fluid. The hysteroscope was advanced through the endocervical canal into the endometrial cavity. The tubal ostia was noted on the right only.  There was a large endometrial polyp that was extending down  into the cervical canal.  The hysteroscope was removed. Using a polyp forcep, the polyp was removed in many pieces until it was small enough to continue removing with the resectoscope with a single loop.  This was performed until the endometrial cavity was without lesion and the contour of the cavity was smooth.  Then a #1 toothed curette was used to curette the cavity until rough gritty texture was noted in all quadrants. With revisualization of the hysteroscope the polyp was fully resected.  At this point, the procedure was ended.  The hysteroscope was removed.  The fluid deficit was 150 cc. The tenaculum was removed from the anterior lip of the cervix. The speculum was removed from the vagina. The prep was cleansed of the patient's skin. The legs are positioned back in the supine position. Sponge, lap, needle, initially counts were correct x2. Patient was taken to recovery in stable condition.  COUNTS:  YES  PLAN OF CARE: Transfer to PACU

## 2013-12-07 NOTE — Anesthesia Postprocedure Evaluation (Signed)
  Anesthesia Post-op Note  Patient: Christine Munoz  Procedure(s) Performed: Procedure(s): DILATATION & CURETTAGE/HYSTEROSCOPY WITH RESECTOCOPE (N/A) Patient is awake and responsive. Pain and nausea are reasonably well controlled. Vital signs are stable and clinically acceptable. Oxygen saturation is clinically acceptable. There are no apparent anesthetic complications at this time. Patient is ready for discharge.

## 2013-12-07 NOTE — Anesthesia Procedure Notes (Signed)
Procedure Name: LMA Insertion Date/Time: 12/07/2013 9:51 AM Performed by: Flossie Dibble Pre-anesthesia Checklist: Patient identified, Patient being monitored, Timeout performed, Emergency Drugs available and Suction available Patient Re-evaluated:Patient Re-evaluated prior to inductionOxygen Delivery Method: Circle system utilized Preoxygenation: Pre-oxygenation with 100% oxygen Intubation Type: IV induction LMA: LMA inserted LMA Size: 4.0 Number of attempts: 1 Placement Confirmation: breath sounds checked- equal and bilateral and positive ETCO2 Tube secured with: Tape Dental Injury: Teeth and Oropharynx as per pre-operative assessment

## 2013-12-08 ENCOUNTER — Encounter (HOSPITAL_COMMUNITY): Payer: Self-pay | Admitting: Obstetrics & Gynecology

## 2013-12-18 DIAGNOSIS — E785 Hyperlipidemia, unspecified: Secondary | ICD-10-CM | POA: Diagnosis not present

## 2013-12-18 DIAGNOSIS — E2839 Other primary ovarian failure: Secondary | ICD-10-CM | POA: Diagnosis not present

## 2013-12-18 DIAGNOSIS — E559 Vitamin D deficiency, unspecified: Secondary | ICD-10-CM | POA: Diagnosis not present

## 2013-12-22 ENCOUNTER — Ambulatory Visit (INDEPENDENT_AMBULATORY_CARE_PROVIDER_SITE_OTHER): Payer: Medicare Other | Admitting: Obstetrics & Gynecology

## 2013-12-22 VITALS — BP 138/88 | HR 64 | Resp 16 | Ht 66.0 in | Wt 259.0 lb

## 2013-12-22 DIAGNOSIS — M25569 Pain in unspecified knee: Secondary | ICD-10-CM | POA: Diagnosis not present

## 2013-12-22 DIAGNOSIS — M25562 Pain in left knee: Secondary | ICD-10-CM

## 2013-12-22 DIAGNOSIS — N840 Polyp of corpus uteri: Secondary | ICD-10-CM

## 2013-12-22 NOTE — Progress Notes (Signed)
Patient scheduled for referral appointment with Dr. Almedia Balls for knee pain while in office for 9/9 at 2:15pm. Patient is agreeable to date and time.

## 2013-12-22 NOTE — Progress Notes (Signed)
Post Operative Visit  Procedure: D&C Hysteroscopy Days Post-op: 16  Subjective: H/O hysteroscopy and polyp resection due to PMP bleeding and large endometrial polyp.  Photos and pathology reviewed with pt.  Pathology showed benign endometrial polyp.  Denies vaginal bleeding.  Has done well from surgical standpoint.  No pain.  Never took any of the prescription pain medication.  Pt reports on Sunday, she was at church and twisted and is not having severe pain in left knee.  Right hip/buttocks pain has significantly improved.  Denies feeling any heat in knee.  Has pain with standing, sitting, or elevating knee.  Pain is mostly on inner aspect of left knee.  Doesn't know what to do for evaluation.  Objective: BP 138/88  Pulse 64  Resp 16  Ht 5\' 6"  (1.676 m)  Wt 259 lb (117.482 kg)  BMI 41.82 kg/m2  EXAM General: alert and cooperative Resp: clear to auscultation bilaterally Cardio: regular rate and rhythm, S1, S2 normal, no murmur, click, rub or gallop GI: soft, non-tender; bowel sounds normal; no masses,  no organomegaly Extremities: extremities normal, atraumatic, no cyanosis or edema Vaginal Bleeding: minimal Gyn:  No VB or discharge, cervix closed, no CMT  Assessment: s/p hysteroscopy with polyp resection, D&C New left knee pain for last three days  Plan: Follow up Prn or one year Will refer to Dr. Lynann Bologna

## 2013-12-23 ENCOUNTER — Encounter: Payer: Self-pay | Admitting: Obstetrics & Gynecology

## 2013-12-23 DIAGNOSIS — M25562 Pain in left knee: Secondary | ICD-10-CM | POA: Insufficient documentation

## 2013-12-24 DIAGNOSIS — R7309 Other abnormal glucose: Secondary | ICD-10-CM | POA: Diagnosis not present

## 2013-12-24 DIAGNOSIS — E7800 Pure hypercholesterolemia, unspecified: Secondary | ICD-10-CM | POA: Diagnosis not present

## 2013-12-24 DIAGNOSIS — M549 Dorsalgia, unspecified: Secondary | ICD-10-CM | POA: Diagnosis not present

## 2013-12-24 DIAGNOSIS — Z0000 Encounter for general adult medical examination without abnormal findings: Secondary | ICD-10-CM | POA: Diagnosis not present

## 2013-12-30 DIAGNOSIS — M1710 Unilateral primary osteoarthritis, unspecified knee: Secondary | ICD-10-CM | POA: Diagnosis not present

## 2013-12-30 DIAGNOSIS — M48061 Spinal stenosis, lumbar region without neurogenic claudication: Secondary | ICD-10-CM | POA: Diagnosis not present

## 2014-01-01 DIAGNOSIS — H903 Sensorineural hearing loss, bilateral: Secondary | ICD-10-CM | POA: Diagnosis not present

## 2014-01-13 DIAGNOSIS — M238X9 Other internal derangements of unspecified knee: Secondary | ICD-10-CM | POA: Diagnosis not present

## 2014-01-19 DIAGNOSIS — M1710 Unilateral primary osteoarthritis, unspecified knee: Secondary | ICD-10-CM | POA: Diagnosis not present

## 2014-01-25 DIAGNOSIS — M84352D Stress fracture, left femur, subsequent encounter for fracture with routine healing: Secondary | ICD-10-CM | POA: Diagnosis not present

## 2014-01-27 DIAGNOSIS — Z23 Encounter for immunization: Secondary | ICD-10-CM | POA: Diagnosis not present

## 2014-02-07 ENCOUNTER — Emergency Department (HOSPITAL_COMMUNITY): Payer: Medicare Other

## 2014-02-07 ENCOUNTER — Encounter (HOSPITAL_COMMUNITY): Payer: Self-pay | Admitting: Emergency Medicine

## 2014-02-07 DIAGNOSIS — E785 Hyperlipidemia, unspecified: Secondary | ICD-10-CM | POA: Diagnosis not present

## 2014-02-07 DIAGNOSIS — M1990 Unspecified osteoarthritis, unspecified site: Secondary | ICD-10-CM | POA: Insufficient documentation

## 2014-02-07 DIAGNOSIS — Z7982 Long term (current) use of aspirin: Secondary | ICD-10-CM | POA: Insufficient documentation

## 2014-02-07 DIAGNOSIS — M25572 Pain in left ankle and joints of left foot: Secondary | ICD-10-CM | POA: Diagnosis not present

## 2014-02-07 DIAGNOSIS — Z79899 Other long term (current) drug therapy: Secondary | ICD-10-CM | POA: Diagnosis not present

## 2014-02-07 DIAGNOSIS — M7989 Other specified soft tissue disorders: Secondary | ICD-10-CM | POA: Diagnosis not present

## 2014-02-07 DIAGNOSIS — Z87828 Personal history of other (healed) physical injury and trauma: Secondary | ICD-10-CM | POA: Insufficient documentation

## 2014-02-07 DIAGNOSIS — R2242 Localized swelling, mass and lump, left lower limb: Secondary | ICD-10-CM | POA: Insufficient documentation

## 2014-02-07 DIAGNOSIS — Z87891 Personal history of nicotine dependence: Secondary | ICD-10-CM | POA: Diagnosis not present

## 2014-02-07 DIAGNOSIS — Z8719 Personal history of other diseases of the digestive system: Secondary | ICD-10-CM | POA: Diagnosis not present

## 2014-02-07 NOTE — ED Notes (Signed)
C/o swelling and pain to L ankle since 10pm.  States she elevated foot but swelling did not improve.  Denies calf pain.  No known injury.  States she was told she had a stress fx to L knee on 10/5 and has been wearing a brace on L knee.  Pt has been standing a lot today at church.

## 2014-02-08 ENCOUNTER — Emergency Department (HOSPITAL_COMMUNITY)
Admission: EM | Admit: 2014-02-08 | Discharge: 2014-02-08 | Disposition: A | Discharge status: Home / Self Care | Payer: Medicare Other | Attending: Emergency Medicine | Admitting: Emergency Medicine

## 2014-02-08 ENCOUNTER — Ambulatory Visit (HOSPITAL_COMMUNITY)
Admission: RE | Admit: 2014-02-08 | Discharge: 2014-02-08 | Disposition: A | Discharge status: Home / Self Care | Payer: Medicare Other | Source: Ambulatory Visit | Attending: Emergency Medicine | Admitting: Emergency Medicine

## 2014-02-08 DIAGNOSIS — R2242 Localized swelling, mass and lump, left lower limb: Secondary | ICD-10-CM | POA: Diagnosis not present

## 2014-02-08 DIAGNOSIS — M7989 Other specified soft tissue disorders: Secondary | ICD-10-CM

## 2014-02-08 DIAGNOSIS — M79609 Pain in unspecified limb: Secondary | ICD-10-CM | POA: Diagnosis not present

## 2014-02-08 MED ORDER — MORPHINE SULFATE 4 MG/ML IJ SOLN: 4.0000 mg | Freq: Once | INTRAMUSCULAR | Status: DC

## 2014-02-08 MED ORDER — ENOXAPARIN SODIUM 120 MG/0.8ML ~~LOC~~ SOLN
1.0000 mg/kg | Freq: Once | SUBCUTANEOUS | Status: AC
  Administered 2014-02-08: 115 mg via SUBCUTANEOUS
  Filled 2014-02-08: qty 0.8

## 2014-02-08 NOTE — ED Provider Notes (Signed)
CSN: [id number]     Arrival date & time 02/07/14  2255 History   First MD Initiated Contact with Patient 02/08/14 0113     Chief Complaint  Patient presents with  . Ankle Pain     (Consider location/radiation/quality/duration/timing/severity/associated sxs/prior Treatment) HPI Patient presents with left lower extremity swelling starting today. She states over the last 3 days she's been wearing a left knee sleeve for her arthritis. She admits to increased standing and walking today. She complains of swelling especially to the left ankle and mild tenderness to the left calf. No color change. Patient denies any fevers or chills. She has no shortness of breath or chest pain. Patient states she makes frequent 3-4 hour car ride it takes multiple stops. No personal or family history of DVT or PE.   Past Medical History  Diagnosis Date  . Hyperlipidemia     borderline  . Leg abrasion     had to go to the wound center-? diag  . Arthritis   . Anemia     h/o anemia  . Shortness of breath     due to her weight- per pt  . GERD (gastroesophageal reflux disease)     no meds   Past Surgical History  Procedure Laterality Date  . Tubal ligation    . Tonsillectomy    . Dilatation & currettage/hysteroscopy with resectocope N/A 12/07/2013    Procedure: [place];  Surgeon: Lyman Speller, MD;  Location: [facility];  Service: Gynecology;  Laterality: N/A;   Family History  Problem Relation Age of Onset  . Liver cancer Sister   . Brain cancer Father     tumor  . Arthritis Mother   . Colon cancer Maternal Uncle   . Leukemia Maternal Uncle    History  Substance Use Topics  . Smoking status: Former Research scientist (life sciences)  . Smokeless tobacco: Never Used     Comment: years ago-not heavy/quit 2003/2004  . Alcohol Use: 0.0 - 0.5 oz/week    0-1 drink(s) per week   OB History   Grav Para Term Preterm Abortions TAB SAB Ect Mult Living   1 1        1      Review of  Systems  Constitutional: Negative for fever and chills.  Respiratory: Negative for cough and shortness of breath.   Cardiovascular: Positive for leg swelling. Negative for chest pain and palpitations.  Gastrointestinal: Negative for nausea, vomiting, abdominal pain and diarrhea.  Musculoskeletal: Negative for back pain, myalgias, neck pain and neck stiffness.  Skin: Negative for rash and wound.  Neurological: Negative for dizziness, weakness, light-headedness, numbness and headaches.  All other systems reviewed and are negative.     Allergies  Review of patient's allergies indicates no known allergies.  Home Medications   Prior to Admission medications   Medication Sig Start Date End Date Taking? Authorizing Provider  acetaminophen (TYLENOL) 500 MG tablet Take 1,000 mg by mouth every 6 (six) hours as needed for moderate pain.    Historical Provider, MD  aspirin 81 MG tablet Take 81 mg by mouth daily.    Historical Provider, MD  CALCIUM PO Take by mouth daily.    Historical Provider, MD  Cholecalciferol (VITAMIN D PO) Take 4,000 Int'l Units by mouth.    Historical Provider, MD  Hydrocodone-Acetaminophen (VICODIN) 5-300 MG TABS Take 1 tablet by mouth every 6 (six) hours. May take two tablets if needed 12/07/13   Lyman Speller, MD  ibuprofen (ADVIL,MOTRIN)  200 MG tablet Take 200 mg by mouth every 6 (six) hours as needed.    Historical Provider, MD  Multiple Vitamins-Minerals (MULTIVITAMIN PO) Take by mouth daily.    Historical Provider, MD  Omega-3 Fatty Acids (FISH OIL PO) Take by mouth daily.    Historical Provider, MD  traMADol (ULTRAM) 50 MG tablet Take 1 tablet (50 mg total) by mouth 2 (two) times daily. 12/07/13   Lyman Speller, MD   BP 146/87  Pulse 66  Temp(Src) 97.8 F (36.6 C) (Oral)  Resp 15  Ht 5\' 6"  (1.676 m)  Wt 257 lb (116.574 kg)  BMI 41.50 kg/m2  SpO2 100% Physical Exam  Nursing note and vitals reviewed. Constitutional: She is oriented to person,  place, and time. She appears well-developed and well-nourished. No distress.  HENT:  Head: Normocephalic and atraumatic.  Mouth/Throat: Oropharynx is clear and moist.  Eyes: EOM are normal. Pupils are equal, round, and reactive to light.  Neck: Normal range of motion. Neck supple.  Cardiovascular: Normal rate and regular rhythm.   Pulmonary/Chest: Effort normal and breath sounds normal. No respiratory distress. She has no wheezes. She has no rales. She exhibits no tenderness.  Abdominal: Soft. Bowel sounds are normal. She exhibits no distension and no mass. There is no tenderness. There is no rebound and no guarding.  Musculoskeletal: Normal range of motion. She exhibits edema. She exhibits no tenderness.  2+ edema to the left ankle. No bony abnormality. 2+ dorsalis pedis pulse. Mild calf tenderness without swelling or firmness.  Neurological: She is alert and oriented to person, place, and time.  Skin: Skin is warm and dry. No rash noted. No erythema.  Psychiatric: She has a normal mood and affect. Her behavior is normal.    ED Course  Procedures (including critical care time) Labs Review Labs Reviewed - No data to display  Imaging Review Dg Ankle Complete Left  02/07/2014   CLINICAL DATA:  Swelling and pain to the left ankle since 10 p.m. No known injury. Stress fracture to the left knee on 01/25/2014 and wearing a brace on the left knee.  EXAM: LEFT ANKLE COMPLETE - 3+ VIEW  COMPARISON:  None.  FINDINGS: Diffuse soft tissue swelling about the left ankle. No evidence of acute fracture or dislocation. No focal bone lesion or bone destruction. Bone cortex and trabecular architecture appear intact. Vascular calcifications present in the soft tissues. No radiopaque soft tissue foreign bodies. Plantar and Achilles calcaneal spurs.  IMPRESSION: Diffuse soft tissue swelling about the left ankle. No acute bony abnormalities.   Electronically Signed   By: Lucienne Capers M.D.   On: 02/07/2014 23:56      EKG Interpretation None      MDM   Final diagnoses:  None      Subcutaneous dose of Lovenox given in the emergency department. We'll schedule patient for outpatient DVT study tomorrow morning. Patient has been given strict return precautions and has voiced understanding.  Julianne Rice, MD 02/08/14 [phone]

## 2014-02-08 NOTE — Discharge Instructions (Signed)

## 2014-02-08 NOTE — ED Notes (Signed)
Patient discharged with all personal belongings. 

## 2014-02-11 DIAGNOSIS — I878 Other specified disorders of veins: Secondary | ICD-10-CM | POA: Diagnosis not present

## 2014-02-22 ENCOUNTER — Encounter (HOSPITAL_COMMUNITY): Payer: Self-pay | Admitting: Emergency Medicine

## 2014-03-23 ENCOUNTER — Telehealth: Payer: Self-pay | Admitting: *Deleted

## 2014-03-23 NOTE — Telephone Encounter (Signed)
Please contact pt to schedule an appt with Dr. Blenda Mounts, old Dr. Janus Molder pt.

## 2014-03-30 ENCOUNTER — Other Ambulatory Visit: Payer: Self-pay

## 2014-03-30 DIAGNOSIS — Z1231 Encounter for screening mammogram for malignant neoplasm of breast: Secondary | ICD-10-CM

## 2014-04-13 ENCOUNTER — Ambulatory Visit (INDEPENDENT_AMBULATORY_CARE_PROVIDER_SITE_OTHER): Payer: Medicare Other

## 2014-04-13 DIAGNOSIS — M2040 Other hammer toe(s) (acquired), unspecified foot: Secondary | ICD-10-CM

## 2014-04-13 DIAGNOSIS — R52 Pain, unspecified: Secondary | ICD-10-CM

## 2014-04-13 DIAGNOSIS — M7750 Other enthesopathy of unspecified foot: Secondary | ICD-10-CM

## 2014-04-13 DIAGNOSIS — M2011 Hallux valgus (acquired), right foot: Secondary | ICD-10-CM | POA: Diagnosis not present

## 2014-04-13 DIAGNOSIS — M778 Other enthesopathies, not elsewhere classified: Secondary | ICD-10-CM

## 2014-04-13 DIAGNOSIS — M779 Enthesopathy, unspecified: Secondary | ICD-10-CM

## 2014-04-13 NOTE — Progress Notes (Signed)
   Subjective:    Patient ID: Christine Munoz, female    DOB: April 02, 1941, 73 y.o.   MRN: [id number]  HPI  PT STATED B/L BUNION AND TOES BEEN SORE FOR 5 YEARS. THE FOOT ARE GETTING ESPECIALLY WHEN WEARING SHOES. TRIED NO TREATMENT.  Review of Systems  Musculoskeletal: Positive for back pain and joint swelling.  Skin: Positive for rash.  All other systems reviewed and are negative.      Objective:   Physical Exam 73 year old F connecting female well-developed well-nourished oriented 3 presents this time with bilateral foot and toe pain. Patient cases toes of gotten worse over the last several years with arthritis stiffness pain with walking to these ambulation and movement of the toes most significant is the right great toe and hammertoes on the right foot. Pain with enclosed shoes and rigid contracture of the second third and fourth toes with adductovarus rotated fifth as well there is rigid hammertoe deformity lateral deviation of the hallux noted lower extremity objective findings as follows vascular status is intact pedal pulses palpable DP and PT +2 over 4 bilateral capillary refill time 3 seconds all digits. Epicritic and proprioceptive sensations intact and symmetric bilateral there is normal plantar response DTRs not listed neurologically skin color pigment normal hair growth absent nails somewhat criptotic otherwise unremarkable orthopedic exam there is notable HAV deformity lateral deviation of the hallux with hammertoes 23 and 4 bilateral right more significant worse than left. There is pain both on range of motion palpation of the MTP joints and IP joints lesser digits. Pain on palpation of the MTP joint first as well with enclosed shoe wear. X-rays confirm elevated I am angle greater than 12-14 hallux abductus angle greater than 25 deviation of sesamoids position 4-5 bilateral and rigid hammertoe contractures 234 with contracture at the IP joint being noted. Patient is tried changing  shoes pads cushions and at NSAIDs all with temporary relief is requesting more permanent correction at this time because inhibiting her functions and activities.       Assessment & Plan:  Assessment this time #1 hallux abductor valgus deformity bilateral #234 hammertoe deformities lesser digits 234 bilateral right foot more significant and painful than left and patient is incision surgical prevention at this time based on that fact consent form for [facility] bunionectomy right foot with K wire fixation as well as hammertoe repair to 3 and 4 right foot are reviewed and signed all questions asked by the patient are answered there were medications and surgery scheduled in January of this year at the [facility]. Consent forms were reviewed and signed did review the risks and color pigment and alternatives changing shoes pads cushions and does understand the risks including infections a slow healing nonhealing scar tissue etc. Plan once after the first surgery allowing adequate healing time. She understands she'll be in air fracture boot for at least a one-month duration with moderate it or minimal weightbearing and slowly increase his activities over the one month. Surgery scheduled in the next month next  Harriet Masson DPM

## 2014-04-13 NOTE — Patient Instructions (Signed)
Pre-Operative Instructions  Congratulations, you have decided to take an important step to improving your quality of life.  You can be assured that the doctors of [facility] will be with you every step of the way.  1. Plan to be at the surgery center/hospital at least 1 (one) hour prior to your scheduled time unless otherwise directed by the surgical center/hospital staff.  You must have a responsible adult accompany you, remain during the surgery and drive you home.  Make sure you have directions to the surgical center/hospital and know how to get there on time. 2. For hospital based surgery you will need to obtain a history and physical form from your family physician within 1 month prior to the date of surgery- we will give you a form for you primary physician.  3. We make every effort to accommodate the date you request for surgery.  There are however, times where surgery dates or times have to be moved.  We will contact you as soon as possible if a change in schedule is required.   4. No Aspirin/Ibuprofen for one week before surgery.  If you are on aspirin, any non-steroidal anti-inflammatory medications (Mobic, Aleve, Ibuprofen) you should stop taking it 7 days prior to your surgery.  You make take Tylenol  For pain prior to surgery.  5. Medications- If you are taking daily heart and blood pressure medications, seizure, reflux, allergy, asthma, anxiety, pain or diabetes medications, make sure the surgery center/hospital is aware before the day of surgery so they may notify you which medications to take or avoid the day of surgery. 6. No food or drink after midnight the night before surgery unless directed otherwise by surgical center/hospital staff. 7. No alcoholic beverages 24 hours prior to surgery.  No smoking 24 hours prior to or 24 hours after surgery. 8. Wear loose pants or shorts- loose enough to fit over bandages, boots, and casts. 9. No slip on shoes, sneakers are best. 10. Bring  your boot with you to the surgery center/hospital.  Also bring crutches or a walker if your physician has prescribed it for you.  If you do not have this equipment, it will be provided for you after surgery. 11. If you have not been contracted by the surgery center/hospital by the day before your surgery, call to confirm the date and time of your surgery. 12. Leave-time from work may vary depending on the type of surgery you have.  Appropriate arrangements should be made prior to surgery with your employer. 13. Prescriptions will be provided immediately following surgery by your doctor.  Have these filled as soon as possible after surgery and take the medication as directed. 14. Remove nail polish on the operative foot. 15. Wash the night before surgery.  The night before surgery wash the foot and leg well with the antibacterial soap provided and water paying special attention to beneath the toenails and in between the toes.  Rinse thoroughly with water and dry well with a towel.  Perform this wash unless told not to do so by your physician.  Enclosed: 1 Ice pack (please put in freezer the night before surgery)   1 Hibiclens skin cleaner   Pre-op Instructions  If you have any questions regarding the instructions, do not hesitate to call our office.  [place]: [address] [place], [place] [address] [phone]  [place]: [address] [place], [place] [address] [phone]  [place]: [address]  [place], [place] [address] [phone]  Dr. Kowen Kluth   Tuchman DPM, Dr. Norman Regal DPM Dr. Jaelie Aguilera DPM, Dr. M. Todd Hyatt DPM, Dr. Kathryn Egerton DPM 

## 2014-04-23 HISTORY — PX: FOOT SURGERY: SHX648

## 2014-04-29 ENCOUNTER — Ambulatory Visit
Admission: RE | Admit: 2014-04-29 | Discharge: 2014-04-29 | Disposition: A | Discharge status: Home / Self Care | Payer: Medicare Other | Source: Ambulatory Visit

## 2014-04-29 DIAGNOSIS — Z1231 Encounter for screening mammogram for malignant neoplasm of breast: Secondary | ICD-10-CM | POA: Diagnosis not present

## 2014-05-05 DIAGNOSIS — M2041 Other hammer toe(s) (acquired), right foot: Secondary | ICD-10-CM | POA: Diagnosis not present

## 2014-05-05 DIAGNOSIS — M2011 Hallux valgus (acquired), right foot: Secondary | ICD-10-CM | POA: Diagnosis not present

## 2014-05-05 DIAGNOSIS — K219 Gastro-esophageal reflux disease without esophagitis: Secondary | ICD-10-CM | POA: Diagnosis not present

## 2014-05-05 DIAGNOSIS — M25571 Pain in right ankle and joints of right foot: Secondary | ICD-10-CM | POA: Diagnosis not present

## 2014-05-11 ENCOUNTER — Ambulatory Visit (INDEPENDENT_AMBULATORY_CARE_PROVIDER_SITE_OTHER): Payer: Medicare Other

## 2014-05-11 VITALS — BP 148/79 | HR 69 | Resp 12

## 2014-05-11 DIAGNOSIS — M2011 Hallux valgus (acquired), right foot: Secondary | ICD-10-CM | POA: Diagnosis not present

## 2014-05-11 DIAGNOSIS — Z09 Encounter for follow-up examination after completed treatment for conditions other than malignant neoplasm: Secondary | ICD-10-CM

## 2014-05-11 DIAGNOSIS — M2040 Other hammer toe(s) (acquired), unspecified foot: Secondary | ICD-10-CM

## 2014-05-11 NOTE — Progress Notes (Signed)
DO"S 05/05/2014 right [place] bunionectomy, right 2, 3, 4 hammer toe repair with pins

## 2014-05-11 NOTE — Patient Instructions (Signed)
Betadine Soak Instructions  Purchase an 8 oz. bottle of BETADINE solution (Povidone)  THE DAY AFTER THE PROCEDURE  Place 1 tablespoon of betadine solution in a quart of warm tap water.  Submerge your foot or feet with outer bandage intact for the initial soak; this will allow the bandage to become moist and wet for easy lift off.  Once you remove your bandage, continue to soak in the solution for 20 minutes.  This soak should be done twice a day.  Next, remove your foot or feet from solution, blot dry the affected area and cover.  You may use a band aid large enough to cover the area or use gauze and tape.  Apply other medications to the area as directed by the doctor such as cortisporin otic solution (ear drops) or neosporin.  IF YOUR SKIN BECOMES IRRITATED WHILE USING THESE INSTRUCTIONS, IT IS OKAY TO SWITCH TO EPSOM SALTS AND WATER OR WHITE VINEGAR AND WATER.  Elevate and ice foot whenever possible help determine swelling and inflammation

## 2014-05-11 NOTE — Progress Notes (Signed)
   Subjective:    Patient ID: Christine Munoz, female    DOB: 05-19-1940, 74 y.o.   MRN: [id number]  HPI  DO"S 05/05/2014 right [place] bunionectomy, right 2, 3, 4 hammer toe repair with pins.'' RT FOOT AND TOES ARE DOING OK BUT LITTLE TENDER ON THE TOES.''  Review of Systems no new findings or systemic changes     Objective:   Physical Exam Neurovascular status is intact pedal pulses are well palpated incision well coapted no dehiscence no discharge or drainage dressings intact and dry digits are good rectus position. X-rays reveal good position alignment and K wire fixations and ankle appearance noted. No open wounds no ulcers no dehiscence patient having minimal discomfort mild edema and ecchymosis consistent with postop course       Assessment & Plan:  Assessment good postop progress 6 days status post [place] bunionectomy right foot as well as hammertoe repair to 3 and 4 of the right foot incisions clean dry well coapted presto compressive dressing reapplied patient is advised she may remove the boot periodically and exercise the foot and ankle and is instructed reapplications boot with the dressing intact and dry as instructed. Maintain elevation ice for swelling Tylenol or Advil as needed for pain recheck in one week plan for suture removal at that time.  Harriet Masson DPM

## 2014-05-12 ENCOUNTER — Telehealth: Payer: Self-pay | Admitting: *Deleted

## 2014-05-12 NOTE — Telephone Encounter (Addendum)
Pt left message with her name, birth date and phone number.  Pt states she received soaking instructions when checking out yesterday, but Dr. Blenda Mounts stated to keep the surgery foot dry and in the surgery dressing.  I read yesterday's dictation for pt and she is to keep the surgical dressing in place until next visit.  Pt is informed.

## 2014-05-18 ENCOUNTER — Ambulatory Visit: Payer: Medicare Other

## 2014-05-18 ENCOUNTER — Ambulatory Visit (INDEPENDENT_AMBULATORY_CARE_PROVIDER_SITE_OTHER): Payer: Medicare Other

## 2014-05-18 VITALS — BP 128/72 | HR 69 | Resp 12

## 2014-05-18 DIAGNOSIS — M2011 Hallux valgus (acquired), right foot: Secondary | ICD-10-CM

## 2014-05-18 DIAGNOSIS — Z09 Encounter for follow-up examination after completed treatment for conditions other than malignant neoplasm: Secondary | ICD-10-CM

## 2014-05-18 DIAGNOSIS — M2040 Other hammer toe(s) (acquired), unspecified foot: Secondary | ICD-10-CM

## 2014-05-18 NOTE — Progress Notes (Signed)
   Subjective:    Patient ID: Christine Munoz, female    DOB: 11-11-1940, 75 y.o.   MRN: [id number]  HPI  DOS 05/05/2014 right [place] bunionectomy, right 2, 3, 4 hammer toe repair with pins. ''RT FOOT TOES ARE DOING OK BUT STILL SORE.''  Review of Systems no new findings or systemic changes noted     Objective:   Physical Exam Neurovascular status is intact pedal pulses are palpable DP and PT +2 over 4 Refill time intact K wires are intact and minimally rotated digits 234 and a rectus position sutures are removed at this time. Coflex wrapping her buddy wrap in of toes 2-3 and 4 is his done an anklet was applied to the foot for compression. Maintain anklet to maintain buddy wrapping for the next 3 or 4 weeks as instructed. Starting tomorrow may resume normal bathing and hygiene wash and foot with soap and water reapply Coflex wrap and anklet.       Assessment & Plan:  Assessment good postop progress she is two-week status post [place] bunionectomy right as well as hammertoe repair to 3 and 4 right foot. Incisions well coapted good clinical and radiographic alignments are noted maintain air fracture boot anklet and compression stockings reappointed in 3 weeks for follow-up x-ray and likely pin removal at that visit.  Harriet Masson DPM

## 2014-05-18 NOTE — Patient Instructions (Addendum)
ICE INSTRUCTIONS  Apply ice or cold pack to the affected area at least 3 times a day for 10-15 minutes each time.  You should also use ice after prolonged activity or vigorous exercise.  Do not apply ice longer than 20 minutes at one time.  Always keep a cloth between your skin and the ice pack to prevent burns.  Being consistent and following these instructions will help control your symptoms.  We suggest you purchase a gel ice pack because they are reusable and do bit leak.  Some of them are designed to wrap around the area.  Use the method that works best for you.  Here are some other suggestions for icing.   Use a frozen bag of peas or corn-inexpensive and molds well to your body, usually stays frozen for 10 to 20 minutes.  Wet a towel with cold water and squeeze out the excess until it's damp.  Place in a bag in the freezer for 20 minutes. Then remove and use.  May resume normal bathing and hygiene wash with soap and water apply Neosporin to the incision and pin sites. Maintain Coflex wrapping of toes to keep down on swelling and maintain position.  Maintain compression wrapping during the day may remove the compression wrap in the evenings while sleeping however maintain boot for the next 3 weeks as instructed

## 2014-06-08 ENCOUNTER — Ambulatory Visit (INDEPENDENT_AMBULATORY_CARE_PROVIDER_SITE_OTHER): Payer: Medicare Other

## 2014-06-08 VITALS — BP 134/86 | HR 86 | Resp 12

## 2014-06-08 DIAGNOSIS — M2011 Hallux valgus (acquired), right foot: Secondary | ICD-10-CM

## 2014-06-08 DIAGNOSIS — M2040 Other hammer toe(s) (acquired), unspecified foot: Secondary | ICD-10-CM

## 2014-06-08 DIAGNOSIS — Z09 Encounter for follow-up examination after completed treatment for conditions other than malignant neoplasm: Secondary | ICD-10-CM

## 2014-06-08 NOTE — Progress Notes (Signed)
   Subjective:    Patient ID: Christine Munoz, female    DOB: Jan 31, 1941, 74 y.o.   MRN: [id number]  HPI DOS 05/05/2014 right [place] bunionectomy, right 2, 3, 4 hammer toe repair with pins.  ''RT FOOT AND TOES ARE DOING OK BUT STILL LITTLE TENDER.''   Review of Systems no new findings or systemic changes noted     Objective:   Physical Exam Patient presents this time five-week status post [place] bunionectomy right foot and K wire fixation second third and fourth right foot. At this time x-rays reveal good position the osteotomy site distraction of the K wire of the first metatarsal however lesser metatarsals are well aligned and phalanges are well aligned ready for K wire removal at this time incisions clean dry well coapted no dehiscence no discharge no drainage. At this time the site is prepped with Betadine and Neosporin the pins second third and fourth toes are removed. Coflex ready wrapping or buddy wrapping of toes 23-4 is carried out and instructed patient will maintain buddy wrapping of toes maintain compression anklet new fracture boot and resume a comfortable walking tennis or athletic shoe at this time.       Assessment & Plan:  Assessment good postop progress neurovascular status is intact maintain Coflex wrapping of toes resume walking tennis or athletic shoe recheck in one month for follow-up will likely start planning for contralateral left foot surgery in the future. Recommend Tylenol or Advil for pain and elevated and ice when needed Harriet Masson DPM

## 2014-06-08 NOTE — Patient Instructions (Signed)
ICE INSTRUCTIONS  Apply ice or cold pack to the affected area at least 3 times a day for 10-15 minutes each time.  You should also use ice after prolonged activity or vigorous exercise.  Do not apply ice longer than 20 minutes at one time.  Always keep a cloth between your skin and the ice pack to prevent burns.  Being consistent and following these instructions will help control your symptoms.  We suggest you purchase a gel ice pack because they are reusable and do bit leak.  Some of them are designed to wrap around the area.  Use the method that works best for you.  Here are some other suggestions for icing.   Use a frozen bag of peas or corn-inexpensive and molds well to your body, usually stays frozen for 10 to 20 minutes.  Wet a towel with cold water and squeeze out the excess until it's damp.  Place in a bag in the freezer for 20 minutes. Then remove and use.  Maintain wrapping of toes buddy wrap second third and fourth toes together as instructed for at least the next 2 weeks. Maintain normal bathing and hygiene. Continue with Neosporin to the incision sites, to help reduce scarring

## 2014-06-24 DIAGNOSIS — E559 Vitamin D deficiency, unspecified: Secondary | ICD-10-CM | POA: Diagnosis not present

## 2014-06-24 DIAGNOSIS — E780 Pure hypercholesterolemia: Secondary | ICD-10-CM | POA: Diagnosis not present

## 2014-06-24 DIAGNOSIS — R7309 Other abnormal glucose: Secondary | ICD-10-CM | POA: Diagnosis not present

## 2014-06-30 DIAGNOSIS — E780 Pure hypercholesterolemia: Secondary | ICD-10-CM | POA: Diagnosis not present

## 2014-06-30 DIAGNOSIS — Z1389 Encounter for screening for other disorder: Secondary | ICD-10-CM | POA: Diagnosis not present

## 2014-06-30 DIAGNOSIS — Z23 Encounter for immunization: Secondary | ICD-10-CM | POA: Diagnosis not present

## 2014-06-30 DIAGNOSIS — Z0000 Encounter for general adult medical examination without abnormal findings: Secondary | ICD-10-CM | POA: Diagnosis not present

## 2014-07-06 ENCOUNTER — Ambulatory Visit (INDEPENDENT_AMBULATORY_CARE_PROVIDER_SITE_OTHER): Payer: Medicare Other

## 2014-07-06 VITALS — BP 147/84 | HR 64 | Resp 12

## 2014-07-06 DIAGNOSIS — M2040 Other hammer toe(s) (acquired), unspecified foot: Secondary | ICD-10-CM

## 2014-07-06 DIAGNOSIS — M2011 Hallux valgus (acquired), right foot: Secondary | ICD-10-CM | POA: Diagnosis not present

## 2014-07-06 DIAGNOSIS — M792 Neuralgia and neuritis, unspecified: Secondary | ICD-10-CM

## 2014-07-06 DIAGNOSIS — M778 Other enthesopathies, not elsewhere classified: Secondary | ICD-10-CM

## 2014-07-06 DIAGNOSIS — M7750 Other enthesopathy of unspecified foot: Secondary | ICD-10-CM

## 2014-07-06 DIAGNOSIS — M779 Enthesopathy, unspecified: Secondary | ICD-10-CM

## 2014-07-06 DIAGNOSIS — Z09 Encounter for follow-up examination after completed treatment for conditions other than malignant neoplasm: Secondary | ICD-10-CM

## 2014-07-06 DIAGNOSIS — R609 Edema, unspecified: Secondary | ICD-10-CM

## 2014-07-06 NOTE — Progress Notes (Signed)
   Subjective:    Patient ID: Christine Munoz, female    DOB: 24-Aug-1940, 74 y.o.   MRN: [id number]  HPI  DOS 05/05/2014 right [place] bunionectomy, right 2, 3, 4 hammer toe repair with pins.  ''RT FOOT STILL LITTLE SWOLLEN AND SORE.''  Review of Systems no new findings or systemic changes noted     Objective:   Physical Exam Neurovascular status is intact pedal pulses are palpable incision clean dry well coapted there still is edema of the first toe as well as lesser toes 23 and 4 following pin removal there x-rays reveal good alignment of the first metatarsal intact wires slightly distracted although not palpable. There is still swelling lesser digits and patient does have some paresthesia describes a numbness on the lateral side of her distal right hallux may be many against adjacent second digit and compressing with the compression stocking in place however cannot rule out neuroma some nerve injury from the incision itself and from swelling patient is advised some of the numbness and tingling and abnormal sensation resolve over a 3-6 month period postop. Patient did request her nails be trimmed at this time is provided to her is advised is likely a noncovered service in the future.       Assessment & Plan:  Assessment good postop progress following bunionectomy and multiple hammertoe repairs patient continues to have some swelling in arthropathy and capsulitis and edema continue with active passive range of motion exercises maintain compression stockings return in 2 months for long-term follow-up with x-ray  Harriet Masson DPM

## 2014-07-06 NOTE — Patient Instructions (Signed)
ICE INSTRUCTIONS  Apply ice or cold pack to the affected area at least 3 times a day for 10-15 minutes each time.  You should also use ice after prolonged activity or vigorous exercise.  Do not apply ice longer than 20 minutes at one time.  Always keep a cloth between your skin and the ice pack to prevent burns.  Being consistent and following these instructions will help control your symptoms.  We suggest you purchase a gel ice pack because they are reusable and do bit leak.  Some of them are designed to wrap around the area.  Use the method that works best for you.  Here are some other suggestions for icing.   Use a frozen bag of peas or corn-inexpensive and molds well to your body, usually stays frozen for 10 to 20 minutes.  Wet a towel with cold water and squeeze out the excess until it's damp.  Place in a bag in the freezer for 20 minutes. Then remove and use.  Maintain compression stocking. Use the foam pads to keep the toes separated. Maintain a wide toebox on shoes no tight or constrictive shoes.

## 2014-08-31 DIAGNOSIS — H5200 Hypermetropia, unspecified eye: Secondary | ICD-10-CM | POA: Diagnosis not present

## 2014-08-31 DIAGNOSIS — H35363 Drusen (degenerative) of macula, bilateral: Secondary | ICD-10-CM | POA: Diagnosis not present

## 2014-08-31 DIAGNOSIS — H269 Unspecified cataract: Secondary | ICD-10-CM | POA: Diagnosis not present

## 2014-08-31 DIAGNOSIS — H2513 Age-related nuclear cataract, bilateral: Secondary | ICD-10-CM | POA: Diagnosis not present

## 2014-09-07 ENCOUNTER — Ambulatory Visit (INDEPENDENT_AMBULATORY_CARE_PROVIDER_SITE_OTHER): Payer: Medicare Other | Admitting: Podiatry

## 2014-09-07 ENCOUNTER — Ambulatory Visit (INDEPENDENT_AMBULATORY_CARE_PROVIDER_SITE_OTHER): Payer: Medicare Other

## 2014-09-07 ENCOUNTER — Encounter: Payer: Self-pay | Admitting: Podiatry

## 2014-09-07 VITALS — BP 160/82 | HR 61 | Resp 18

## 2014-09-07 DIAGNOSIS — M2011 Hallux valgus (acquired), right foot: Secondary | ICD-10-CM

## 2014-09-07 DIAGNOSIS — Z9889 Other specified postprocedural states: Secondary | ICD-10-CM

## 2014-09-07 DIAGNOSIS — M2041 Other hammer toe(s) (acquired), right foot: Secondary | ICD-10-CM | POA: Diagnosis not present

## 2014-09-07 NOTE — Progress Notes (Signed)
She presents today for final follow-up visit regarding her right foot. She is status post [place] bunion repair with screw fixation hammertoe repair with pins to toes #2 #3 in number for the right foot. She still has some numbness and tingling between her toes particularly the hallux and the second toe. She is concerned about deformity of the second toe. She denies fever chills nausea vomiting muscle aches and pains.  Objective: Vital signs are stable she is alert and oriented 3. She has well healing surgical foot with good range of motion of all joints with exception of those that were to be fused. Radiograph confirms a well-healed hammertoe repairs #2 through 4 right foot as well as a Futures trader with pin fixation. The patient is slightly retrograde but is not palpable on palpation.  Assessment: Well-healing surgical foot right.  Plan: Encouraged range of motion exercises ambulation and will follow-up with her on an as-needed basis.

## 2014-09-30 DIAGNOSIS — H25041 Posterior subcapsular polar age-related cataract, right eye: Secondary | ICD-10-CM | POA: Diagnosis not present

## 2014-09-30 DIAGNOSIS — H35363 Drusen (degenerative) of macula, bilateral: Secondary | ICD-10-CM | POA: Diagnosis not present

## 2014-09-30 DIAGNOSIS — H2511 Age-related nuclear cataract, right eye: Secondary | ICD-10-CM | POA: Diagnosis not present

## 2014-09-30 DIAGNOSIS — H25011 Cortical age-related cataract, right eye: Secondary | ICD-10-CM | POA: Diagnosis not present

## 2014-10-08 DIAGNOSIS — M5441 Lumbago with sciatica, right side: Secondary | ICD-10-CM | POA: Diagnosis not present

## 2014-10-20 DIAGNOSIS — M5430 Sciatica, unspecified side: Secondary | ICD-10-CM | POA: Diagnosis not present

## 2014-11-04 DIAGNOSIS — M5431 Sciatica, right side: Secondary | ICD-10-CM | POA: Diagnosis not present

## 2014-11-04 DIAGNOSIS — M6281 Muscle weakness (generalized): Secondary | ICD-10-CM | POA: Diagnosis not present

## 2014-11-04 DIAGNOSIS — M79604 Pain in right leg: Secondary | ICD-10-CM | POA: Diagnosis not present

## 2014-11-04 DIAGNOSIS — R262 Difficulty in walking, not elsewhere classified: Secondary | ICD-10-CM | POA: Diagnosis not present

## 2014-11-09 DIAGNOSIS — M79604 Pain in right leg: Secondary | ICD-10-CM | POA: Diagnosis not present

## 2014-11-09 DIAGNOSIS — M6281 Muscle weakness (generalized): Secondary | ICD-10-CM | POA: Diagnosis not present

## 2014-11-09 DIAGNOSIS — M5431 Sciatica, right side: Secondary | ICD-10-CM | POA: Diagnosis not present

## 2014-11-09 DIAGNOSIS — R262 Difficulty in walking, not elsewhere classified: Secondary | ICD-10-CM | POA: Diagnosis not present

## 2014-11-11 DIAGNOSIS — M6281 Muscle weakness (generalized): Secondary | ICD-10-CM | POA: Diagnosis not present

## 2014-11-11 DIAGNOSIS — M5431 Sciatica, right side: Secondary | ICD-10-CM | POA: Diagnosis not present

## 2014-11-11 DIAGNOSIS — R262 Difficulty in walking, not elsewhere classified: Secondary | ICD-10-CM | POA: Diagnosis not present

## 2014-11-11 DIAGNOSIS — M79604 Pain in right leg: Secondary | ICD-10-CM | POA: Diagnosis not present

## 2014-11-12 DIAGNOSIS — M79604 Pain in right leg: Secondary | ICD-10-CM | POA: Diagnosis not present

## 2014-11-12 DIAGNOSIS — M5431 Sciatica, right side: Secondary | ICD-10-CM | POA: Diagnosis not present

## 2014-11-12 DIAGNOSIS — R262 Difficulty in walking, not elsewhere classified: Secondary | ICD-10-CM | POA: Diagnosis not present

## 2014-11-12 DIAGNOSIS — M6281 Muscle weakness (generalized): Secondary | ICD-10-CM | POA: Diagnosis not present

## 2014-11-15 DIAGNOSIS — M6281 Muscle weakness (generalized): Secondary | ICD-10-CM | POA: Diagnosis not present

## 2014-11-15 DIAGNOSIS — R262 Difficulty in walking, not elsewhere classified: Secondary | ICD-10-CM | POA: Diagnosis not present

## 2014-11-15 DIAGNOSIS — M5431 Sciatica, right side: Secondary | ICD-10-CM | POA: Diagnosis not present

## 2014-11-15 DIAGNOSIS — M79604 Pain in right leg: Secondary | ICD-10-CM | POA: Diagnosis not present

## 2014-11-16 DIAGNOSIS — H2511 Age-related nuclear cataract, right eye: Secondary | ICD-10-CM | POA: Diagnosis not present

## 2014-11-22 DIAGNOSIS — M5430 Sciatica, unspecified side: Secondary | ICD-10-CM | POA: Diagnosis not present

## 2014-12-03 DIAGNOSIS — M6281 Muscle weakness (generalized): Secondary | ICD-10-CM | POA: Diagnosis not present

## 2014-12-03 DIAGNOSIS — M5431 Sciatica, right side: Secondary | ICD-10-CM | POA: Diagnosis not present

## 2014-12-03 DIAGNOSIS — R262 Difficulty in walking, not elsewhere classified: Secondary | ICD-10-CM | POA: Diagnosis not present

## 2014-12-03 DIAGNOSIS — M79604 Pain in right leg: Secondary | ICD-10-CM | POA: Diagnosis not present

## 2014-12-06 DIAGNOSIS — M79604 Pain in right leg: Secondary | ICD-10-CM | POA: Diagnosis not present

## 2014-12-06 DIAGNOSIS — R262 Difficulty in walking, not elsewhere classified: Secondary | ICD-10-CM | POA: Diagnosis not present

## 2014-12-06 DIAGNOSIS — M6281 Muscle weakness (generalized): Secondary | ICD-10-CM | POA: Diagnosis not present

## 2014-12-06 DIAGNOSIS — M5431 Sciatica, right side: Secondary | ICD-10-CM | POA: Diagnosis not present

## 2014-12-08 DIAGNOSIS — M6281 Muscle weakness (generalized): Secondary | ICD-10-CM | POA: Diagnosis not present

## 2014-12-08 DIAGNOSIS — R262 Difficulty in walking, not elsewhere classified: Secondary | ICD-10-CM | POA: Diagnosis not present

## 2014-12-08 DIAGNOSIS — M79604 Pain in right leg: Secondary | ICD-10-CM | POA: Diagnosis not present

## 2014-12-08 DIAGNOSIS — M5431 Sciatica, right side: Secondary | ICD-10-CM | POA: Diagnosis not present

## 2014-12-13 DIAGNOSIS — H2512 Age-related nuclear cataract, left eye: Secondary | ICD-10-CM | POA: Diagnosis not present

## 2014-12-13 DIAGNOSIS — H25012 Cortical age-related cataract, left eye: Secondary | ICD-10-CM | POA: Diagnosis not present

## 2014-12-14 DIAGNOSIS — M79604 Pain in right leg: Secondary | ICD-10-CM | POA: Diagnosis not present

## 2014-12-14 DIAGNOSIS — M5431 Sciatica, right side: Secondary | ICD-10-CM | POA: Diagnosis not present

## 2014-12-14 DIAGNOSIS — R262 Difficulty in walking, not elsewhere classified: Secondary | ICD-10-CM | POA: Diagnosis not present

## 2014-12-14 DIAGNOSIS — M6281 Muscle weakness (generalized): Secondary | ICD-10-CM | POA: Diagnosis not present

## 2014-12-28 DIAGNOSIS — H2512 Age-related nuclear cataract, left eye: Secondary | ICD-10-CM | POA: Diagnosis not present

## 2015-01-05 DIAGNOSIS — E780 Pure hypercholesterolemia: Secondary | ICD-10-CM | POA: Diagnosis not present

## 2015-01-05 DIAGNOSIS — E559 Vitamin D deficiency, unspecified: Secondary | ICD-10-CM | POA: Diagnosis not present

## 2015-01-05 DIAGNOSIS — Z0000 Encounter for general adult medical examination without abnormal findings: Secondary | ICD-10-CM | POA: Diagnosis not present

## 2015-01-10 ENCOUNTER — Encounter: Payer: Self-pay | Admitting: Obstetrics & Gynecology

## 2015-01-10 ENCOUNTER — Ambulatory Visit (INDEPENDENT_AMBULATORY_CARE_PROVIDER_SITE_OTHER): Payer: Medicare Other | Admitting: Obstetrics & Gynecology

## 2015-01-10 VITALS — BP 142/80 | HR 64 | Resp 16 | Ht 65.75 in | Wt 244.0 lb

## 2015-01-10 DIAGNOSIS — Z124 Encounter for screening for malignant neoplasm of cervix: Secondary | ICD-10-CM | POA: Diagnosis not present

## 2015-01-10 DIAGNOSIS — M5431 Sciatica, right side: Secondary | ICD-10-CM | POA: Diagnosis not present

## 2015-01-10 DIAGNOSIS — Z01419 Encounter for gynecological examination (general) (routine) without abnormal findings: Secondary | ICD-10-CM | POA: Diagnosis not present

## 2015-01-10 DIAGNOSIS — M5430 Sciatica, unspecified side: Secondary | ICD-10-CM | POA: Insufficient documentation

## 2015-01-10 NOTE — Progress Notes (Addendum)
74 y.o. G1P1 SingleAfrican AmericanF here for annual exam.  "Hidden Figures" book and movie are about her mother.  Mother received Medal of Freedom at the [facility].  Pt has been traveling a lot to help with her mother.  Mother is 27 yo and lives in [place].  Goes to [place] every month for about a week.    Pt denies vaginal bleeding.  Pt reports she feels really well.    Pt reports she had foot surgery for hammer toe.  Went to PT and this really helped.    No LMP recorded. Patient is postmenopausal.          Sexually active: No.  The current method of family planning is post menopausal status.    Exercising: No.  not regularly Smoker:  Former smoker  Health Maintenance: Pap:  11/02/13 WNL History of abnormal Pap:  no MMG:  04/29/14 BiRads 1-negative Colonoscopy:  Within 3-4 years-repeat in 10 years (going to Dr. Maudie Mercury tomorrow and states she will check about).  Records obtained 01/14/15.  Colonoscopy was 01/25/09 with followup recommended in 10 years.  Done with Dr. Benson Norway. BMD:   4-5 years ago with Dr Minna Antis TDaP:  PCP Screening Labs: PCP, Hb today: PCP, Urine today: PCP   reports that she has quit smoking. She has never used smokeless tobacco. She reports that she drinks alcohol. She reports that she does not use illicit drugs.  Past Medical History  Diagnosis Date  . Hyperlipidemia     borderline  . Leg abrasion     had to go to the wound center-? diag  . Arthritis   . Anemia     h/o anemia  . Shortness of breath     due to her weight- per pt  . GERD (gastroesophageal reflux disease)     no meds    Past Surgical History  Procedure Laterality Date  . Tubal ligation    . Tonsillectomy    . Dilatation & currettage/hysteroscopy with resectocope N/A 12/07/2013    Procedure: [place];  Surgeon: Lyman Speller, MD;  Location: [facility];  Service: Gynecology;  Laterality: N/A;  . Foot surgery  1/16    right foot  . Cataract extraction       11/16/14 and 12/13/14    Current Outpatient Prescriptions  Medication Sig Dispense Refill  . aspirin 81 MG tablet Take 81 mg by mouth daily.    . DUREZOL 0.05 % EMUL APPLY 1 DROP IN LEFT EYE THREE TIMES A DAY START AFTER SURGERY  1  . ibuprofen (ADVIL,MOTRIN) 200 MG tablet Take 400 mg by mouth every 6 (six) hours as needed for moderate pain.     Marland Kitchen ketorolac (ACULAR) 0.5 % ophthalmic solution APPLY 1 DROP IN LEFT EYE FOUR TIMES A DAY START 72 HOURS PRIOR TO SURGERY  1  . meloxicam (MOBIC) 15 MG tablet Take 15 mg by mouth daily as needed for pain.    . methocarbamol (ROBAXIN) 500 MG tablet Take 500 mg by mouth every 6 (six) hours.  3  . Multiple Vitamins-Minerals (MULTIVITAMIN PO) Take 1 tablet by mouth daily.     Marland Kitchen ofloxacin (OCUFLOX) 0.3 % ophthalmic solution INSTILL 1 DROP IN BOTH EYES FOUR TIMES A DAY START 72 HOURS PRIOR TO SURGERY  1  . traMADol (ULTRAM) 50 MG tablet Take 100 mg by mouth 2 (two) times daily as needed for moderate pain.     No current facility-administered medications for this visit.  Family History  Problem Relation Age of Onset  . Liver cancer Sister   . Brain cancer Father     tumor  . Arthritis Mother   . Colon cancer Maternal Uncle   . Leukemia Maternal Uncle     ROS:  Pertinent items are noted in HPI.  Otherwise, a comprehensive ROS was negative.  Exam:   BP 142/80 mmHg  Pulse 64  Resp 16  Ht 5' 5.75" (1.67 m)  Wt 244 lb (110.678 kg)  BMI 39.69 kg/m2    Height: 5' 5.75" (167 cm)  Ht Readings from Last 3 Encounters:  01/10/15 5' 5.75" (1.67 m)  02/07/14 5\' 6"  (1.676 m)  12/22/13 5\' 6"  (1.676 m)    General appearance: alert, cooperative and appears stated age Head: Normocephalic, without obvious abnormality, atraumatic Neck: no adenopathy, supple, symmetrical, trachea midline and thyroid normal to inspection and palpation Lungs: clear to auscultation bilaterally Breasts: normal appearance, no masses or tenderness Heart: regular rate and  rhythm Abdomen: soft, non-tender; bowel sounds normal; no masses,  no organomegaly Extremities: extremities normal, atraumatic, no cyanosis or edema Skin: Skin color, texture, turgor normal. No rashes or lesions Lymph nodes: Cervical, supraclavicular, and axillary nodes normal. No abnormal inguinal nodes palpated Neurologic: Grossly normal   Pelvic: External genitalia:  no lesions              Urethra:  normal appearing urethra with no masses, tenderness or lesions              Bartholins and Skenes: normal                 Vagina: normal appearing vagina with normal color and discharge, no lesions              Cervix: no lesions              Pap taken: No. Bimanual Exam:  Uterus:  normal size, contour, position, consistency, mobility, non-tender              Adnexa: normal adnexa and no mass, fullness, tenderness               Rectovaginal: Confirms               Anus:  normal sphincter tone, no lesions  Chaperone was present for exam.  A:  Well Woman with normal exam PMP, no HRT Hysteroscopy, polyp resection D&C 8/15 Sciatica that is much improved with PT  P:   Mammogram yearly pap smear 2015.  No pap smear today. Seeing Dr. Maudie Mercury tomorrow for routine exam. return annually or prn

## 2015-01-11 DIAGNOSIS — R739 Hyperglycemia, unspecified: Secondary | ICD-10-CM | POA: Diagnosis not present

## 2015-01-11 DIAGNOSIS — K7689 Other specified diseases of liver: Secondary | ICD-10-CM | POA: Diagnosis not present

## 2015-01-11 DIAGNOSIS — I10 Essential (primary) hypertension: Secondary | ICD-10-CM | POA: Diagnosis not present

## 2015-01-11 DIAGNOSIS — E559 Vitamin D deficiency, unspecified: Secondary | ICD-10-CM | POA: Diagnosis not present

## 2015-01-11 DIAGNOSIS — Z0000 Encounter for general adult medical examination without abnormal findings: Secondary | ICD-10-CM | POA: Diagnosis not present

## 2015-01-11 DIAGNOSIS — E780 Pure hypercholesterolemia: Secondary | ICD-10-CM | POA: Diagnosis not present

## 2015-01-14 NOTE — Addendum Note (Signed)
Addended by: Megan Salon on: 01/14/2015 06:42 PM   Modules accepted: Miquel Dunn

## 2015-01-20 DIAGNOSIS — Z23 Encounter for immunization: Secondary | ICD-10-CM | POA: Diagnosis not present

## 2015-02-17 DIAGNOSIS — E7800 Pure hypercholesterolemia, unspecified: Secondary | ICD-10-CM | POA: Diagnosis not present

## 2015-02-17 DIAGNOSIS — R739 Hyperglycemia, unspecified: Secondary | ICD-10-CM | POA: Diagnosis not present

## 2015-02-17 DIAGNOSIS — E559 Vitamin D deficiency, unspecified: Secondary | ICD-10-CM | POA: Diagnosis not present

## 2015-02-17 DIAGNOSIS — H9202 Otalgia, left ear: Secondary | ICD-10-CM | POA: Diagnosis not present

## 2015-04-07 DIAGNOSIS — M5431 Sciatica, right side: Secondary | ICD-10-CM | POA: Diagnosis not present

## 2015-04-07 DIAGNOSIS — M79604 Pain in right leg: Secondary | ICD-10-CM | POA: Diagnosis not present

## 2015-04-07 DIAGNOSIS — M6281 Muscle weakness (generalized): Secondary | ICD-10-CM | POA: Diagnosis not present

## 2015-04-07 DIAGNOSIS — R262 Difficulty in walking, not elsewhere classified: Secondary | ICD-10-CM | POA: Diagnosis not present

## 2015-04-11 DIAGNOSIS — R262 Difficulty in walking, not elsewhere classified: Secondary | ICD-10-CM | POA: Diagnosis not present

## 2015-04-11 DIAGNOSIS — M6281 Muscle weakness (generalized): Secondary | ICD-10-CM | POA: Diagnosis not present

## 2015-04-11 DIAGNOSIS — M79604 Pain in right leg: Secondary | ICD-10-CM | POA: Diagnosis not present

## 2015-04-11 DIAGNOSIS — M5431 Sciatica, right side: Secondary | ICD-10-CM | POA: Diagnosis not present

## 2015-04-12 ENCOUNTER — Encounter: Payer: Self-pay | Admitting: Podiatry

## 2015-04-12 ENCOUNTER — Ambulatory Visit (INDEPENDENT_AMBULATORY_CARE_PROVIDER_SITE_OTHER): Payer: Medicare Other | Admitting: Podiatry

## 2015-04-12 ENCOUNTER — Other Ambulatory Visit: Payer: Self-pay

## 2015-04-12 ENCOUNTER — Ambulatory Visit (INDEPENDENT_AMBULATORY_CARE_PROVIDER_SITE_OTHER): Payer: Medicare Other

## 2015-04-12 VITALS — BP 142/88 | HR 71 | Resp 16

## 2015-04-12 DIAGNOSIS — M79671 Pain in right foot: Secondary | ICD-10-CM

## 2015-04-12 DIAGNOSIS — Z1231 Encounter for screening mammogram for malignant neoplasm of breast: Secondary | ICD-10-CM

## 2015-04-12 DIAGNOSIS — M5431 Sciatica, right side: Secondary | ICD-10-CM | POA: Diagnosis not present

## 2015-04-12 MED ORDER — METHYLPREDNISOLONE 4 MG PO TBPK
ORAL_TABLET | ORAL | Status: DC
Start: 2015-04-12 — End: 2017-02-04

## 2015-04-12 NOTE — Progress Notes (Signed)
She presents today for follow-up of pain to her right foot. This is a postsurgical foot surgery having been performed by Dr. Blenda Mounts last year. She states that the foot no longer hurts however she has been having debilitating pain from the lateral aspect of her right leg radiating to her knee sometimes with intermittent spasms of the calf laterally and occasionally running to the thigh in the right buttocks. She seems to think that it is her foot that is causing the radiation of pain that she has been diagnosed by her primary doctor with sciatica the right hip. She is also been told that she has arthritis in her spine it could be resulting and her sciatica. She states that nothing seems to be helping including therapy and anti-inflammatories right  Leg.    objective: Vital signs are stable she is alert and oriented 3. Pulses are strongly palpable. No reproducible signs  Of a similar pain from the foot.   Assessment I think she has neuritis in her leg and foot associated with sciatica from her right hip and back.   Plan: I started her on a Medrol Dosepak today and we'll follow-up with her in 1 month to reevaluate. I recommended that she consider neurosurgery consultation that may result in epidural injections.

## 2015-04-13 DIAGNOSIS — R262 Difficulty in walking, not elsewhere classified: Secondary | ICD-10-CM | POA: Diagnosis not present

## 2015-04-13 DIAGNOSIS — M79604 Pain in right leg: Secondary | ICD-10-CM | POA: Diagnosis not present

## 2015-04-13 DIAGNOSIS — M5431 Sciatica, right side: Secondary | ICD-10-CM | POA: Diagnosis not present

## 2015-04-13 DIAGNOSIS — M6281 Muscle weakness (generalized): Secondary | ICD-10-CM | POA: Diagnosis not present

## 2015-05-06 DIAGNOSIS — R739 Hyperglycemia, unspecified: Secondary | ICD-10-CM | POA: Diagnosis not present

## 2015-05-06 DIAGNOSIS — E559 Vitamin D deficiency, unspecified: Secondary | ICD-10-CM | POA: Diagnosis not present

## 2015-05-06 DIAGNOSIS — K7689 Other specified diseases of liver: Secondary | ICD-10-CM | POA: Diagnosis not present

## 2015-05-12 ENCOUNTER — Encounter: Payer: Self-pay | Admitting: Podiatry

## 2015-05-12 ENCOUNTER — Telehealth: Payer: Self-pay | Admitting: *Deleted

## 2015-05-12 ENCOUNTER — Ambulatory Visit (INDEPENDENT_AMBULATORY_CARE_PROVIDER_SITE_OTHER): Payer: Medicare Other | Admitting: Podiatry

## 2015-05-12 VITALS — BP 149/79 | HR 86 | Resp 12

## 2015-05-12 DIAGNOSIS — R739 Hyperglycemia, unspecified: Secondary | ICD-10-CM | POA: Diagnosis not present

## 2015-05-12 DIAGNOSIS — E7800 Pure hypercholesterolemia, unspecified: Secondary | ICD-10-CM | POA: Diagnosis not present

## 2015-05-12 DIAGNOSIS — E559 Vitamin D deficiency, unspecified: Secondary | ICD-10-CM | POA: Diagnosis not present

## 2015-05-12 DIAGNOSIS — Z1389 Encounter for screening for other disorder: Secondary | ICD-10-CM | POA: Diagnosis not present

## 2015-05-12 DIAGNOSIS — M25551 Pain in right hip: Secondary | ICD-10-CM | POA: Diagnosis not present

## 2015-05-12 NOTE — Telephone Encounter (Signed)
Referral, pt demographics and clinicals faxed.

## 2015-05-13 DIAGNOSIS — M25551 Pain in right hip: Secondary | ICD-10-CM

## 2015-05-14 NOTE — Progress Notes (Signed)
She presents today stating that she still has pain in the proximal part of her right leg which radiates up into her hip.  Objective: Vital signs are stable she's alert and oriented 3. Pulses are palpable. No reproducible pain right lower extremity foot and ankle. Pulses are palpable. Negative Tinel sign.  Assessment: Probable back or hip pain possibly sciatica.  Plan: Referred to orthopedics.

## 2015-05-18 ENCOUNTER — Ambulatory Visit
Admission: RE | Admit: 2015-05-18 | Discharge: 2015-05-18 | Disposition: A | Discharge status: Home / Self Care | Payer: Medicare Other | Source: Ambulatory Visit

## 2015-05-18 DIAGNOSIS — Z1231 Encounter for screening mammogram for malignant neoplasm of breast: Secondary | ICD-10-CM

## 2015-05-24 HISTORY — PX: ROTATOR CUFF REPAIR: SHX139

## 2015-07-20 DIAGNOSIS — B3783 Candidal cheilitis: Secondary | ICD-10-CM | POA: Diagnosis not present

## 2015-08-04 DIAGNOSIS — H524 Presbyopia: Secondary | ICD-10-CM | POA: Diagnosis not present

## 2015-08-04 DIAGNOSIS — Z961 Presence of intraocular lens: Secondary | ICD-10-CM | POA: Diagnosis not present

## 2015-08-04 DIAGNOSIS — H35363 Drusen (degenerative) of macula, bilateral: Secondary | ICD-10-CM | POA: Diagnosis not present

## 2015-08-04 DIAGNOSIS — D3102 Benign neoplasm of left conjunctiva: Secondary | ICD-10-CM | POA: Diagnosis not present

## 2015-09-28 DIAGNOSIS — J40 Bronchitis, not specified as acute or chronic: Secondary | ICD-10-CM | POA: Diagnosis not present

## 2015-09-28 DIAGNOSIS — R739 Hyperglycemia, unspecified: Secondary | ICD-10-CM | POA: Diagnosis not present

## 2015-09-28 DIAGNOSIS — E7800 Pure hypercholesterolemia, unspecified: Secondary | ICD-10-CM | POA: Diagnosis not present

## 2015-10-17 DIAGNOSIS — R739 Hyperglycemia, unspecified: Secondary | ICD-10-CM | POA: Diagnosis not present

## 2015-10-17 DIAGNOSIS — E7800 Pure hypercholesterolemia, unspecified: Secondary | ICD-10-CM | POA: Diagnosis not present

## 2015-10-31 DIAGNOSIS — R739 Hyperglycemia, unspecified: Secondary | ICD-10-CM | POA: Diagnosis not present

## 2015-10-31 DIAGNOSIS — E559 Vitamin D deficiency, unspecified: Secondary | ICD-10-CM | POA: Diagnosis not present

## 2015-10-31 DIAGNOSIS — E7800 Pure hypercholesterolemia, unspecified: Secondary | ICD-10-CM | POA: Diagnosis not present

## 2015-10-31 DIAGNOSIS — M5431 Sciatica, right side: Secondary | ICD-10-CM | POA: Diagnosis not present

## 2015-11-08 DIAGNOSIS — M546 Pain in thoracic spine: Secondary | ICD-10-CM | POA: Diagnosis not present

## 2015-11-08 DIAGNOSIS — M549 Dorsalgia, unspecified: Secondary | ICD-10-CM | POA: Diagnosis not present

## 2015-11-08 DIAGNOSIS — M5416 Radiculopathy, lumbar region: Secondary | ICD-10-CM | POA: Diagnosis not present

## 2015-11-08 DIAGNOSIS — M47816 Spondylosis without myelopathy or radiculopathy, lumbar region: Secondary | ICD-10-CM | POA: Diagnosis not present

## 2015-11-08 DIAGNOSIS — M4316 Spondylolisthesis, lumbar region: Secondary | ICD-10-CM | POA: Diagnosis not present

## 2015-11-08 DIAGNOSIS — M5136 Other intervertebral disc degeneration, lumbar region: Secondary | ICD-10-CM | POA: Diagnosis not present

## 2015-11-11 DIAGNOSIS — M5416 Radiculopathy, lumbar region: Secondary | ICD-10-CM | POA: Diagnosis not present

## 2015-11-14 DIAGNOSIS — M5136 Other intervertebral disc degeneration, lumbar region: Secondary | ICD-10-CM | POA: Diagnosis not present

## 2015-11-14 DIAGNOSIS — Z6836 Body mass index (BMI) 36.0-36.9, adult: Secondary | ICD-10-CM | POA: Diagnosis not present

## 2015-11-14 DIAGNOSIS — M47816 Spondylosis without myelopathy or radiculopathy, lumbar region: Secondary | ICD-10-CM | POA: Diagnosis not present

## 2015-11-14 DIAGNOSIS — M5126 Other intervertebral disc displacement, lumbar region: Secondary | ICD-10-CM | POA: Diagnosis not present

## 2015-11-14 DIAGNOSIS — I10 Essential (primary) hypertension: Secondary | ICD-10-CM | POA: Diagnosis not present

## 2015-11-14 DIAGNOSIS — M4316 Spondylolisthesis, lumbar region: Secondary | ICD-10-CM | POA: Diagnosis not present

## 2015-11-14 DIAGNOSIS — M5416 Radiculopathy, lumbar region: Secondary | ICD-10-CM | POA: Diagnosis not present

## 2016-01-31 DIAGNOSIS — Z23 Encounter for immunization: Secondary | ICD-10-CM | POA: Diagnosis not present

## 2016-02-28 DIAGNOSIS — R739 Hyperglycemia, unspecified: Secondary | ICD-10-CM | POA: Diagnosis not present

## 2016-02-28 DIAGNOSIS — E559 Vitamin D deficiency, unspecified: Secondary | ICD-10-CM | POA: Diagnosis not present

## 2016-02-28 DIAGNOSIS — E7800 Pure hypercholesterolemia, unspecified: Secondary | ICD-10-CM | POA: Diagnosis not present

## 2016-03-06 DIAGNOSIS — M5431 Sciatica, right side: Secondary | ICD-10-CM | POA: Diagnosis not present

## 2016-03-06 DIAGNOSIS — H6123 Impacted cerumen, bilateral: Secondary | ICD-10-CM | POA: Diagnosis not present

## 2016-03-06 DIAGNOSIS — E559 Vitamin D deficiency, unspecified: Secondary | ICD-10-CM | POA: Diagnosis not present

## 2016-03-06 DIAGNOSIS — M25511 Pain in right shoulder: Secondary | ICD-10-CM | POA: Diagnosis not present

## 2016-03-29 DIAGNOSIS — M25511 Pain in right shoulder: Secondary | ICD-10-CM | POA: Diagnosis not present

## 2016-04-02 DIAGNOSIS — M25511 Pain in right shoulder: Secondary | ICD-10-CM | POA: Diagnosis not present

## 2016-04-09 ENCOUNTER — Other Ambulatory Visit: Payer: Self-pay | Admitting: Internal Medicine

## 2016-04-09 DIAGNOSIS — Z1231 Encounter for screening mammogram for malignant neoplasm of breast: Secondary | ICD-10-CM

## 2016-05-21 ENCOUNTER — Ambulatory Visit: Payer: Medicare Other

## 2016-05-21 IMAGING — MR MR LUMBAR SPINE W/O CM
5 series · 41 of 48 positions shown · non-contrast
Comparison: 09/28/2013.

CLINICAL DATA: Low back pain with RIGHT leg pain. Pain extending
from foot to the RIGHT knee.

EXAM:
MRI LUMBAR SPINE WITHOUT CONTRAST
TECHNIQUE: Multiplanar, multisequence MR imaging of the lumbar spine was
performed. No intravenous contrast was administered.

[Series 3: T2 · sagittal · 4.0mm · 0.88mm/px · 6 of 13 slices shown (1 of 2)]
[im 1/13]
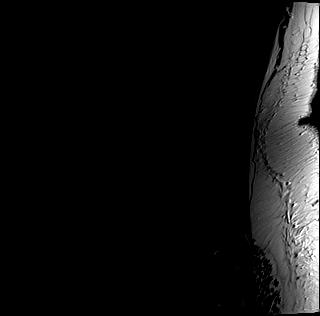
[im 3/13]
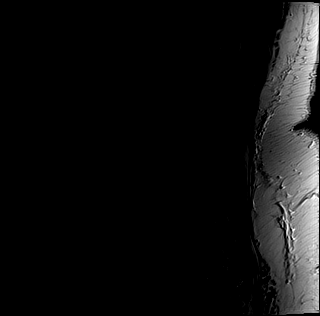
[im 5/13]
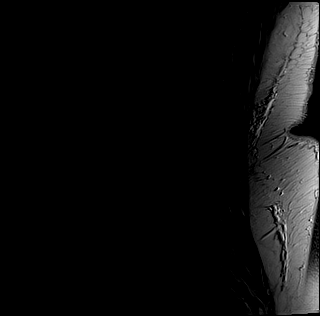
[im 8/13]
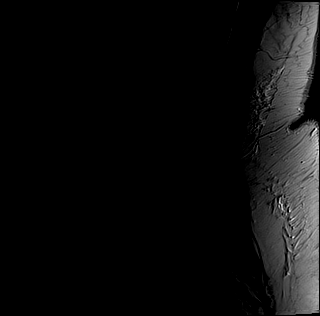
[im 10/13]
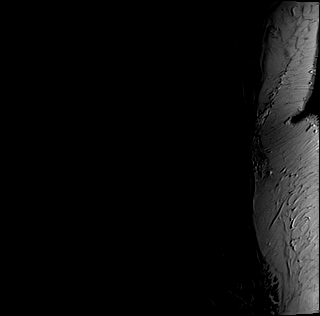
[im 13/13]
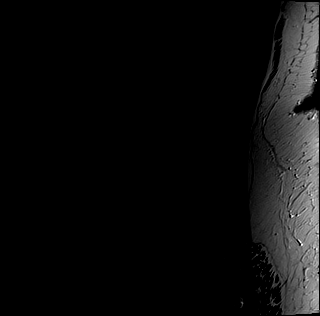

[Series 4: tirm sag · sagittal · 4.0mm · 0.55mm/px · 6 of 13 slices shown]
[im 1/13]
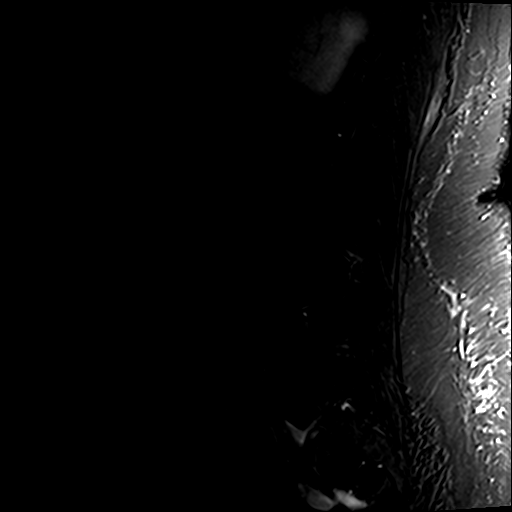
[im 3/13]
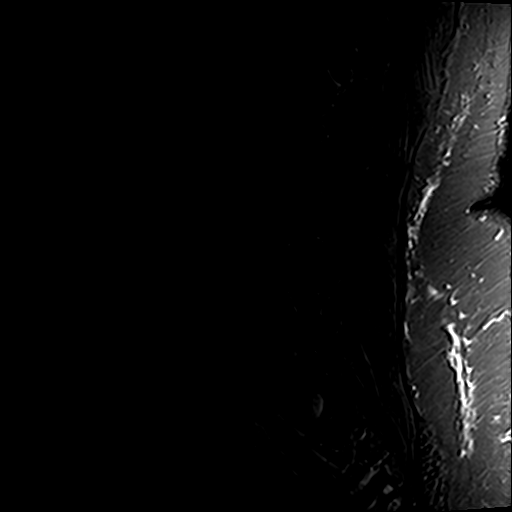
[im 5/13]
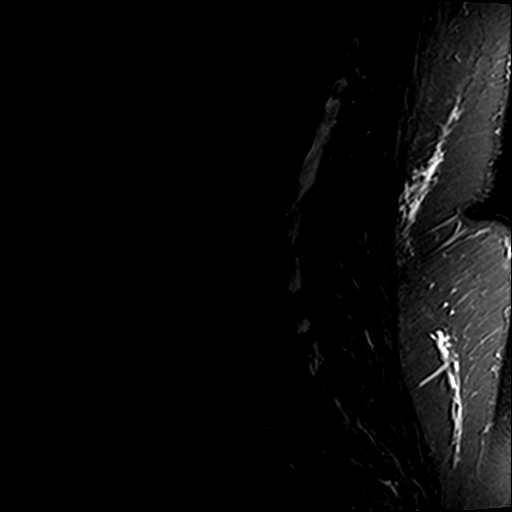
[im 8/13]
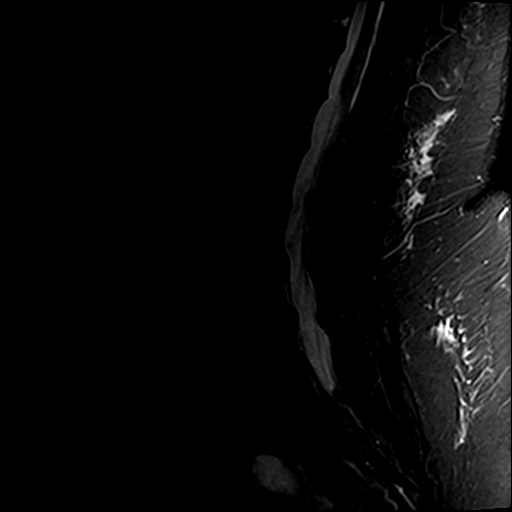
[im 10/13]
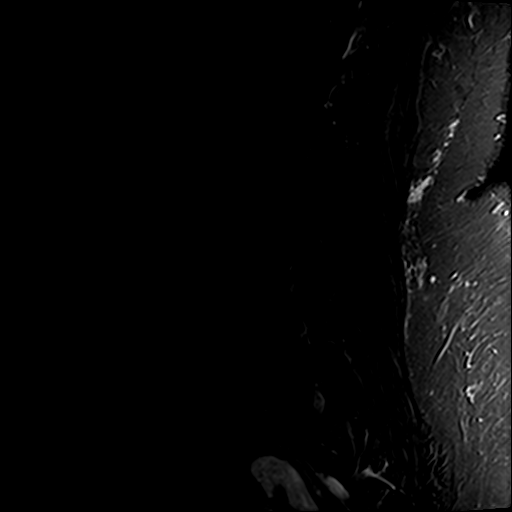
[im 13/13]
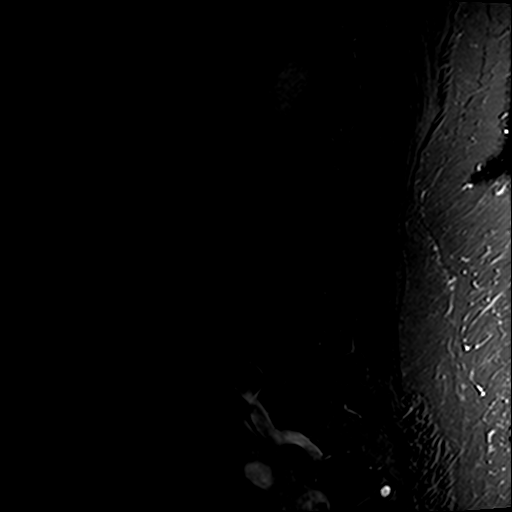

[Series 5: T1 · sagittal · 4.0mm · 0.88mm/px · 6 of 13 slices shown (1 of 2)]
[im 1/13]
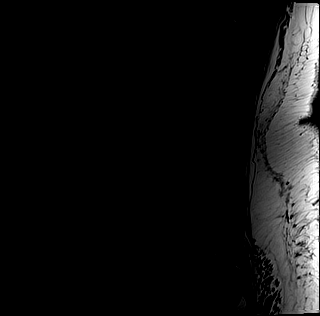
[im 3/13]
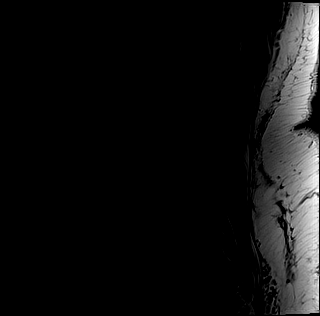
[im 5/13]
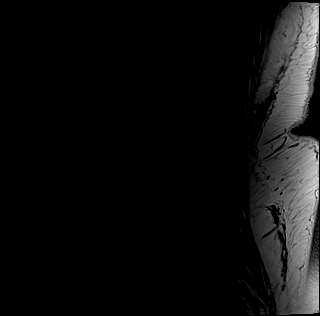
[im 8/13]
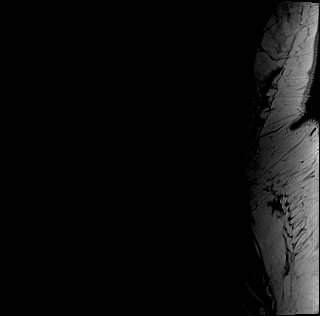
[im 10/13]
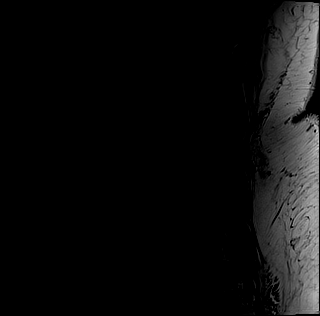
[im 13/13]
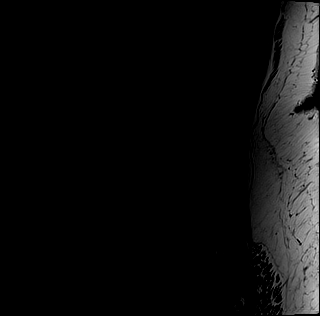

[Series 8: T1 · axial · 4.0mm · 0.67mm/px · z∈[-110,+67]mm · 9 of 32 slices shown (2 of 2)]
[im 1/32]
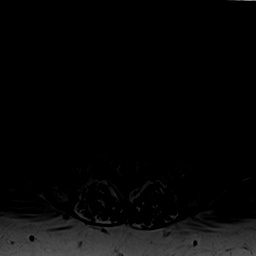
[im 5/32]
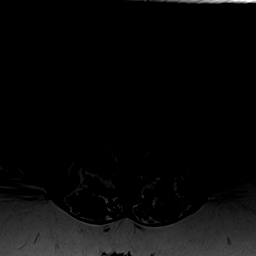
[im 9/32]
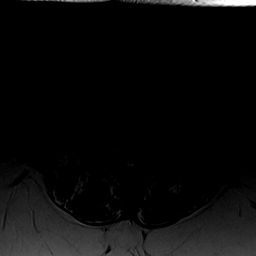
[im 14/32]
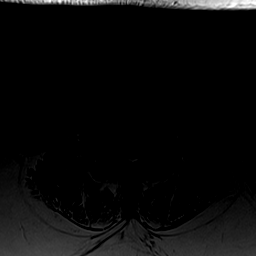
[im 16/32]
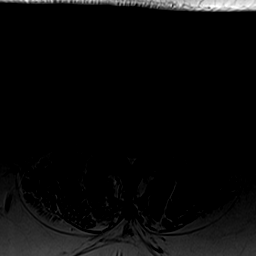
[im 18/32]
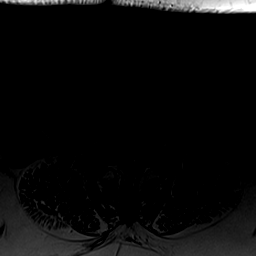
[im 23/32]
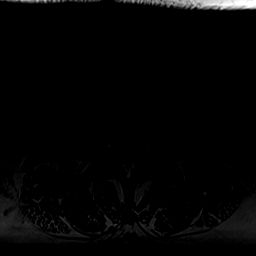
[im 27/32]
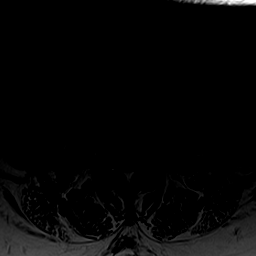
[im 32/32]
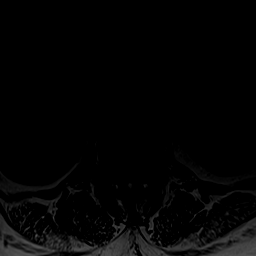

[Series 9: T2 · axial · 4.0mm · 0.68mm/px · z∈[-110,+67]mm · 14 of 32 slices shown (2 of 2)]
[im 1/32]
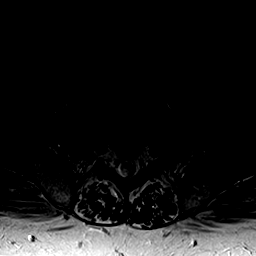
[im 3/32]
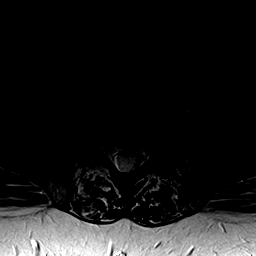
[im 5/32]
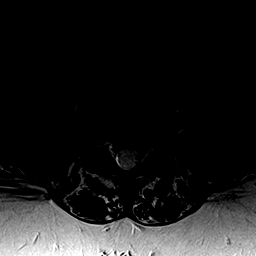
[im 7/32]
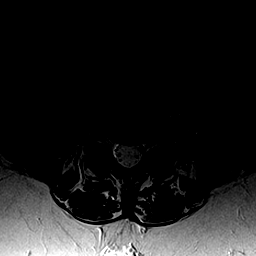
[im 9/32]
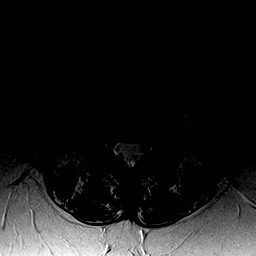
[im 12/32]
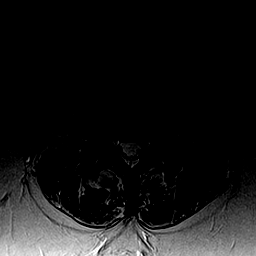
[im 14/32]
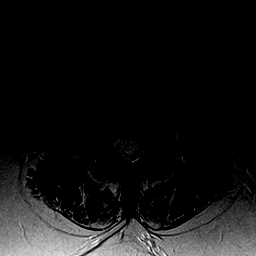
[im 16/32]
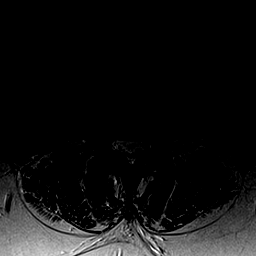
[im 18/32]
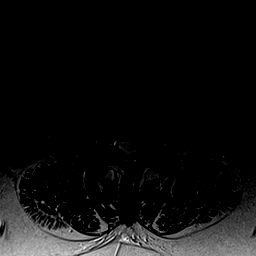
[im 20/32]
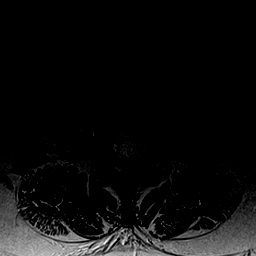
[im 23/32]
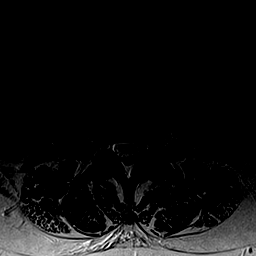
[im 25/32]
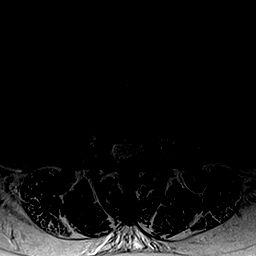
[im 27/32]
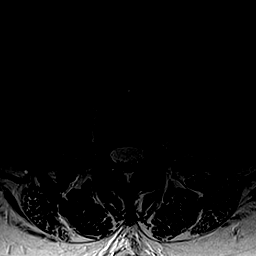
[im 32/32]
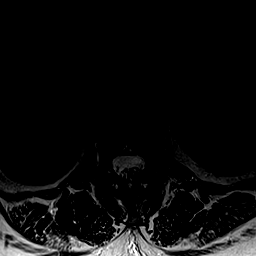

[41 of 48 positions shown; findings below may reference images not displayed]

FINDINGS: Segmentation: The numbering convention used for this exam termed
L5-S1 as the last intervertebral disc space.

Alignment: Mild dextroconvex curve with the apex at L3. 3 mm grade I
anterolisthesis of L3 on L4. 3 mm of grade I anterolisthesis of L4
on L5. At both levels, this appears degenerative, associated with
disc and facet disease.

Vertebrae: No destructive osseous lesions. Scattered Schmorl's
nodes. Sacral marrow signal appears within normal limits.

Conus medullaris: Normal at L1.

Paraspinal tissues: Endometrial thickening is present in the
partially visualized uterus, measuring up to 21 mm in thickness.
Followup pelvic ultrasound is recommended. Fibroid uterus present. 9
mm LEFT periaortic retroperitoneal lymph node is present at the
level of the bifurcation. This is not pathologically enlarged by
size criteria.

Disc levels:

Diffuse disc desiccation is present.

T11-T12 and T12-L1 are seen on sagittal imaging only and show disc
degeneration with mild bulging. No stenosis.

L1-L2: Disc desiccation and degeneration with shallow bulging. No
stenosis.

L2-L3: Disc desiccation. Broad-based posterior disc bulging. Mild
facet hypertrophy. No stenosis.

L3-L4: Moderate to severe bilateral facet arthrosis accounts for the
anterolisthesis. Shallow broad-based disc bulging and uncoverage of
the disc. Neural foramina are adequately patent. Central canal and
lateral recesses patent.

L4-L5: Severe bilateral facet arthrosis. The disc is degenerated
with loss of height and uncoverage associated with facet mediated
anterolisthesis. Moderate RIGHT foraminal stenosis is present
associated with anterolisthesis, disc bulging and RIGHT facet
hypertrophy. The LEFT neural foramen appears patent. Mild
encroachment on the descending LEFT L5 nerve in the lateral recess
due to subarticular spurring.

L5-S1:  Negative.
IMPRESSION: 1. Partial visualization of the uterus shows thickening of the
endometrium, measuring at least 21 mm. Pelvic ultrasound recommended
as the next step in the evaluation to confirm endometrial findings.
Endometrial carcinoma cannot be excluded. Uterine fibroids are
incidentally noted. These results will be called to the ordering
clinician or representative by the Radiologist Assistant, and
communication documented in the PACS or zVision Dashboard.
2. L3-L4 and L4-L5 predominant lumbar degenerative disease with
moderate RIGHT L4 foraminal stenosis potentially affecting the
exiting RIGHT L4 nerve in this patient with RIGHT lower extremity
radiculopathy.
3. Facet mediated grade I anterolisthesis of L3 on L4 and L4 on L5.

## 2016-05-23 DIAGNOSIS — G8918 Other acute postprocedural pain: Secondary | ICD-10-CM | POA: Diagnosis not present

## 2016-05-23 DIAGNOSIS — M7541 Impingement syndrome of right shoulder: Secondary | ICD-10-CM | POA: Diagnosis not present

## 2016-05-23 DIAGNOSIS — M7551 Bursitis of right shoulder: Secondary | ICD-10-CM | POA: Diagnosis not present

## 2016-05-23 DIAGNOSIS — M19011 Primary osteoarthritis, right shoulder: Secondary | ICD-10-CM | POA: Diagnosis not present

## 2016-05-23 DIAGNOSIS — M24111 Other articular cartilage disorders, right shoulder: Secondary | ICD-10-CM | POA: Diagnosis not present

## 2016-05-23 DIAGNOSIS — M75121 Complete rotator cuff tear or rupture of right shoulder, not specified as traumatic: Secondary | ICD-10-CM | POA: Diagnosis not present

## 2016-05-28 DIAGNOSIS — M75121 Complete rotator cuff tear or rupture of right shoulder, not specified as traumatic: Secondary | ICD-10-CM | POA: Diagnosis not present

## 2016-05-28 DIAGNOSIS — R531 Weakness: Secondary | ICD-10-CM | POA: Diagnosis not present

## 2016-05-28 DIAGNOSIS — M25511 Pain in right shoulder: Secondary | ICD-10-CM | POA: Diagnosis not present

## 2016-05-29 DIAGNOSIS — M25511 Pain in right shoulder: Secondary | ICD-10-CM | POA: Diagnosis not present

## 2016-05-31 DIAGNOSIS — M25511 Pain in right shoulder: Secondary | ICD-10-CM | POA: Diagnosis not present

## 2016-05-31 DIAGNOSIS — R531 Weakness: Secondary | ICD-10-CM | POA: Diagnosis not present

## 2016-05-31 DIAGNOSIS — M75121 Complete rotator cuff tear or rupture of right shoulder, not specified as traumatic: Secondary | ICD-10-CM | POA: Diagnosis not present

## 2016-06-04 DIAGNOSIS — M75121 Complete rotator cuff tear or rupture of right shoulder, not specified as traumatic: Secondary | ICD-10-CM | POA: Diagnosis not present

## 2016-06-04 DIAGNOSIS — M25511 Pain in right shoulder: Secondary | ICD-10-CM | POA: Diagnosis not present

## 2016-06-04 DIAGNOSIS — R531 Weakness: Secondary | ICD-10-CM | POA: Diagnosis not present

## 2016-06-07 DIAGNOSIS — M75121 Complete rotator cuff tear or rupture of right shoulder, not specified as traumatic: Secondary | ICD-10-CM | POA: Diagnosis not present

## 2016-06-07 DIAGNOSIS — R531 Weakness: Secondary | ICD-10-CM | POA: Diagnosis not present

## 2016-06-07 DIAGNOSIS — M25511 Pain in right shoulder: Secondary | ICD-10-CM | POA: Diagnosis not present

## 2016-06-12 DIAGNOSIS — M75121 Complete rotator cuff tear or rupture of right shoulder, not specified as traumatic: Secondary | ICD-10-CM | POA: Diagnosis not present

## 2016-06-12 DIAGNOSIS — M25511 Pain in right shoulder: Secondary | ICD-10-CM | POA: Diagnosis not present

## 2016-06-12 DIAGNOSIS — R531 Weakness: Secondary | ICD-10-CM | POA: Diagnosis not present

## 2016-06-15 DIAGNOSIS — R531 Weakness: Secondary | ICD-10-CM | POA: Diagnosis not present

## 2016-06-15 DIAGNOSIS — M75121 Complete rotator cuff tear or rupture of right shoulder, not specified as traumatic: Secondary | ICD-10-CM | POA: Diagnosis not present

## 2016-06-15 DIAGNOSIS — M25511 Pain in right shoulder: Secondary | ICD-10-CM | POA: Diagnosis not present

## 2016-06-18 DIAGNOSIS — M75121 Complete rotator cuff tear or rupture of right shoulder, not specified as traumatic: Secondary | ICD-10-CM | POA: Diagnosis not present

## 2016-06-18 DIAGNOSIS — M25511 Pain in right shoulder: Secondary | ICD-10-CM | POA: Diagnosis not present

## 2016-06-18 DIAGNOSIS — R531 Weakness: Secondary | ICD-10-CM | POA: Diagnosis not present

## 2016-06-21 DIAGNOSIS — M25511 Pain in right shoulder: Secondary | ICD-10-CM | POA: Diagnosis not present

## 2016-06-21 DIAGNOSIS — R531 Weakness: Secondary | ICD-10-CM | POA: Diagnosis not present

## 2016-06-21 DIAGNOSIS — M75121 Complete rotator cuff tear or rupture of right shoulder, not specified as traumatic: Secondary | ICD-10-CM | POA: Diagnosis not present

## 2016-06-25 DIAGNOSIS — M75121 Complete rotator cuff tear or rupture of right shoulder, not specified as traumatic: Secondary | ICD-10-CM | POA: Diagnosis not present

## 2016-06-25 DIAGNOSIS — M25511 Pain in right shoulder: Secondary | ICD-10-CM | POA: Diagnosis not present

## 2016-06-25 DIAGNOSIS — R531 Weakness: Secondary | ICD-10-CM | POA: Diagnosis not present

## 2016-06-26 DIAGNOSIS — M25511 Pain in right shoulder: Secondary | ICD-10-CM | POA: Diagnosis not present

## 2016-06-28 DIAGNOSIS — R739 Hyperglycemia, unspecified: Secondary | ICD-10-CM | POA: Diagnosis not present

## 2016-06-28 DIAGNOSIS — I10 Essential (primary) hypertension: Secondary | ICD-10-CM | POA: Diagnosis not present

## 2016-06-28 DIAGNOSIS — M25511 Pain in right shoulder: Secondary | ICD-10-CM | POA: Diagnosis not present

## 2016-06-28 DIAGNOSIS — R531 Weakness: Secondary | ICD-10-CM | POA: Diagnosis not present

## 2016-06-28 DIAGNOSIS — E559 Vitamin D deficiency, unspecified: Secondary | ICD-10-CM | POA: Diagnosis not present

## 2016-06-28 DIAGNOSIS — M75121 Complete rotator cuff tear or rupture of right shoulder, not specified as traumatic: Secondary | ICD-10-CM | POA: Diagnosis not present

## 2016-07-02 DIAGNOSIS — M25511 Pain in right shoulder: Secondary | ICD-10-CM | POA: Diagnosis not present

## 2016-07-02 DIAGNOSIS — R531 Weakness: Secondary | ICD-10-CM | POA: Diagnosis not present

## 2016-07-02 DIAGNOSIS — M75121 Complete rotator cuff tear or rupture of right shoulder, not specified as traumatic: Secondary | ICD-10-CM | POA: Diagnosis not present

## 2016-07-04 DIAGNOSIS — E7800 Pure hypercholesterolemia, unspecified: Secondary | ICD-10-CM | POA: Diagnosis not present

## 2016-07-04 DIAGNOSIS — M79601 Pain in right arm: Secondary | ICD-10-CM | POA: Diagnosis not present

## 2016-07-04 DIAGNOSIS — E559 Vitamin D deficiency, unspecified: Secondary | ICD-10-CM | POA: Diagnosis not present

## 2016-07-04 DIAGNOSIS — R739 Hyperglycemia, unspecified: Secondary | ICD-10-CM | POA: Diagnosis not present

## 2016-07-05 DIAGNOSIS — M25511 Pain in right shoulder: Secondary | ICD-10-CM | POA: Diagnosis not present

## 2016-07-05 DIAGNOSIS — R531 Weakness: Secondary | ICD-10-CM | POA: Diagnosis not present

## 2016-07-05 DIAGNOSIS — M75121 Complete rotator cuff tear or rupture of right shoulder, not specified as traumatic: Secondary | ICD-10-CM | POA: Diagnosis not present

## 2016-07-09 DIAGNOSIS — R531 Weakness: Secondary | ICD-10-CM | POA: Diagnosis not present

## 2016-07-09 DIAGNOSIS — M25511 Pain in right shoulder: Secondary | ICD-10-CM | POA: Diagnosis not present

## 2016-07-09 DIAGNOSIS — M75121 Complete rotator cuff tear or rupture of right shoulder, not specified as traumatic: Secondary | ICD-10-CM | POA: Diagnosis not present

## 2016-07-12 DIAGNOSIS — R531 Weakness: Secondary | ICD-10-CM | POA: Diagnosis not present

## 2016-07-12 DIAGNOSIS — M75121 Complete rotator cuff tear or rupture of right shoulder, not specified as traumatic: Secondary | ICD-10-CM | POA: Diagnosis not present

## 2016-07-12 DIAGNOSIS — M25511 Pain in right shoulder: Secondary | ICD-10-CM | POA: Diagnosis not present

## 2016-07-16 DIAGNOSIS — M25511 Pain in right shoulder: Secondary | ICD-10-CM | POA: Diagnosis not present

## 2016-07-16 DIAGNOSIS — R531 Weakness: Secondary | ICD-10-CM | POA: Diagnosis not present

## 2016-07-16 DIAGNOSIS — M75121 Complete rotator cuff tear or rupture of right shoulder, not specified as traumatic: Secondary | ICD-10-CM | POA: Diagnosis not present

## 2016-07-19 DIAGNOSIS — M75121 Complete rotator cuff tear or rupture of right shoulder, not specified as traumatic: Secondary | ICD-10-CM | POA: Diagnosis not present

## 2016-07-19 DIAGNOSIS — M25511 Pain in right shoulder: Secondary | ICD-10-CM | POA: Diagnosis not present

## 2016-07-19 DIAGNOSIS — R531 Weakness: Secondary | ICD-10-CM | POA: Diagnosis not present

## 2016-07-26 DIAGNOSIS — M25511 Pain in right shoulder: Secondary | ICD-10-CM | POA: Diagnosis not present

## 2016-07-26 DIAGNOSIS — M75121 Complete rotator cuff tear or rupture of right shoulder, not specified as traumatic: Secondary | ICD-10-CM | POA: Diagnosis not present

## 2016-07-26 DIAGNOSIS — R531 Weakness: Secondary | ICD-10-CM | POA: Diagnosis not present

## 2016-07-27 DIAGNOSIS — M75121 Complete rotator cuff tear or rupture of right shoulder, not specified as traumatic: Secondary | ICD-10-CM | POA: Diagnosis not present

## 2016-07-27 DIAGNOSIS — M25511 Pain in right shoulder: Secondary | ICD-10-CM | POA: Diagnosis not present

## 2016-07-27 DIAGNOSIS — R531 Weakness: Secondary | ICD-10-CM | POA: Diagnosis not present

## 2016-07-30 DIAGNOSIS — M75121 Complete rotator cuff tear or rupture of right shoulder, not specified as traumatic: Secondary | ICD-10-CM | POA: Diagnosis not present

## 2016-07-30 DIAGNOSIS — R531 Weakness: Secondary | ICD-10-CM | POA: Diagnosis not present

## 2016-07-30 DIAGNOSIS — M25511 Pain in right shoulder: Secondary | ICD-10-CM | POA: Diagnosis not present

## 2016-07-31 DIAGNOSIS — M25511 Pain in right shoulder: Secondary | ICD-10-CM | POA: Diagnosis not present

## 2016-08-02 DIAGNOSIS — M75121 Complete rotator cuff tear or rupture of right shoulder, not specified as traumatic: Secondary | ICD-10-CM | POA: Diagnosis not present

## 2016-08-02 DIAGNOSIS — R531 Weakness: Secondary | ICD-10-CM | POA: Diagnosis not present

## 2016-08-02 DIAGNOSIS — M25511 Pain in right shoulder: Secondary | ICD-10-CM | POA: Diagnosis not present

## 2016-08-06 DIAGNOSIS — M75121 Complete rotator cuff tear or rupture of right shoulder, not specified as traumatic: Secondary | ICD-10-CM | POA: Diagnosis not present

## 2016-08-06 DIAGNOSIS — R531 Weakness: Secondary | ICD-10-CM | POA: Diagnosis not present

## 2016-08-06 DIAGNOSIS — M25511 Pain in right shoulder: Secondary | ICD-10-CM | POA: Diagnosis not present

## 2016-08-09 DIAGNOSIS — M75121 Complete rotator cuff tear or rupture of right shoulder, not specified as traumatic: Secondary | ICD-10-CM | POA: Diagnosis not present

## 2016-08-09 DIAGNOSIS — R531 Weakness: Secondary | ICD-10-CM | POA: Diagnosis not present

## 2016-08-09 DIAGNOSIS — M25511 Pain in right shoulder: Secondary | ICD-10-CM | POA: Diagnosis not present

## 2016-08-13 DIAGNOSIS — M25511 Pain in right shoulder: Secondary | ICD-10-CM | POA: Diagnosis not present

## 2016-08-13 DIAGNOSIS — R531 Weakness: Secondary | ICD-10-CM | POA: Diagnosis not present

## 2016-08-13 DIAGNOSIS — M75121 Complete rotator cuff tear or rupture of right shoulder, not specified as traumatic: Secondary | ICD-10-CM | POA: Diagnosis not present

## 2016-08-16 DIAGNOSIS — M75121 Complete rotator cuff tear or rupture of right shoulder, not specified as traumatic: Secondary | ICD-10-CM | POA: Diagnosis not present

## 2016-08-16 DIAGNOSIS — R531 Weakness: Secondary | ICD-10-CM | POA: Diagnosis not present

## 2016-08-16 DIAGNOSIS — M25511 Pain in right shoulder: Secondary | ICD-10-CM | POA: Diagnosis not present

## 2016-08-20 DIAGNOSIS — M75121 Complete rotator cuff tear or rupture of right shoulder, not specified as traumatic: Secondary | ICD-10-CM | POA: Diagnosis not present

## 2016-08-20 DIAGNOSIS — M25511 Pain in right shoulder: Secondary | ICD-10-CM | POA: Diagnosis not present

## 2016-08-20 DIAGNOSIS — R531 Weakness: Secondary | ICD-10-CM | POA: Diagnosis not present

## 2016-08-23 DIAGNOSIS — M75121 Complete rotator cuff tear or rupture of right shoulder, not specified as traumatic: Secondary | ICD-10-CM | POA: Diagnosis not present

## 2016-08-23 DIAGNOSIS — M25511 Pain in right shoulder: Secondary | ICD-10-CM | POA: Diagnosis not present

## 2016-08-23 DIAGNOSIS — R531 Weakness: Secondary | ICD-10-CM | POA: Diagnosis not present

## 2016-08-27 DIAGNOSIS — R531 Weakness: Secondary | ICD-10-CM | POA: Diagnosis not present

## 2016-08-27 DIAGNOSIS — M25511 Pain in right shoulder: Secondary | ICD-10-CM | POA: Diagnosis not present

## 2016-08-27 DIAGNOSIS — M75121 Complete rotator cuff tear or rupture of right shoulder, not specified as traumatic: Secondary | ICD-10-CM | POA: Diagnosis not present

## 2016-08-28 DIAGNOSIS — M25562 Pain in left knee: Secondary | ICD-10-CM | POA: Diagnosis not present

## 2016-08-28 DIAGNOSIS — M25561 Pain in right knee: Secondary | ICD-10-CM | POA: Diagnosis not present

## 2016-08-29 DIAGNOSIS — M75121 Complete rotator cuff tear or rupture of right shoulder, not specified as traumatic: Secondary | ICD-10-CM | POA: Diagnosis not present

## 2016-08-29 DIAGNOSIS — R531 Weakness: Secondary | ICD-10-CM | POA: Diagnosis not present

## 2016-08-29 DIAGNOSIS — M25511 Pain in right shoulder: Secondary | ICD-10-CM | POA: Diagnosis not present

## 2016-09-05 DIAGNOSIS — M75121 Complete rotator cuff tear or rupture of right shoulder, not specified as traumatic: Secondary | ICD-10-CM | POA: Diagnosis not present

## 2016-09-05 DIAGNOSIS — M25511 Pain in right shoulder: Secondary | ICD-10-CM | POA: Diagnosis not present

## 2016-09-05 DIAGNOSIS — R531 Weakness: Secondary | ICD-10-CM | POA: Diagnosis not present

## 2016-09-07 DIAGNOSIS — M75121 Complete rotator cuff tear or rupture of right shoulder, not specified as traumatic: Secondary | ICD-10-CM | POA: Diagnosis not present

## 2016-09-07 DIAGNOSIS — R531 Weakness: Secondary | ICD-10-CM | POA: Diagnosis not present

## 2016-09-07 DIAGNOSIS — M25511 Pain in right shoulder: Secondary | ICD-10-CM | POA: Diagnosis not present

## 2016-09-10 DIAGNOSIS — M25511 Pain in right shoulder: Secondary | ICD-10-CM | POA: Diagnosis not present

## 2016-09-10 DIAGNOSIS — M75121 Complete rotator cuff tear or rupture of right shoulder, not specified as traumatic: Secondary | ICD-10-CM | POA: Diagnosis not present

## 2016-09-10 DIAGNOSIS — R531 Weakness: Secondary | ICD-10-CM | POA: Diagnosis not present

## 2016-09-12 DIAGNOSIS — H04123 Dry eye syndrome of bilateral lacrimal glands: Secondary | ICD-10-CM | POA: Diagnosis not present

## 2016-09-12 DIAGNOSIS — Z961 Presence of intraocular lens: Secondary | ICD-10-CM | POA: Diagnosis not present

## 2016-09-12 DIAGNOSIS — H26491 Other secondary cataract, right eye: Secondary | ICD-10-CM | POA: Diagnosis not present

## 2016-09-12 DIAGNOSIS — H35363 Drusen (degenerative) of macula, bilateral: Secondary | ICD-10-CM | POA: Diagnosis not present

## 2016-09-13 DIAGNOSIS — R531 Weakness: Secondary | ICD-10-CM | POA: Diagnosis not present

## 2016-09-13 DIAGNOSIS — M75121 Complete rotator cuff tear or rupture of right shoulder, not specified as traumatic: Secondary | ICD-10-CM | POA: Diagnosis not present

## 2016-09-13 DIAGNOSIS — M25511 Pain in right shoulder: Secondary | ICD-10-CM | POA: Diagnosis not present

## 2016-09-21 DIAGNOSIS — M25511 Pain in right shoulder: Secondary | ICD-10-CM | POA: Diagnosis not present

## 2016-09-21 DIAGNOSIS — R531 Weakness: Secondary | ICD-10-CM | POA: Diagnosis not present

## 2016-09-21 DIAGNOSIS — M75121 Complete rotator cuff tear or rupture of right shoulder, not specified as traumatic: Secondary | ICD-10-CM | POA: Diagnosis not present

## 2016-09-24 DIAGNOSIS — M75121 Complete rotator cuff tear or rupture of right shoulder, not specified as traumatic: Secondary | ICD-10-CM | POA: Diagnosis not present

## 2016-09-24 DIAGNOSIS — M25511 Pain in right shoulder: Secondary | ICD-10-CM | POA: Diagnosis not present

## 2016-09-24 DIAGNOSIS — R531 Weakness: Secondary | ICD-10-CM | POA: Diagnosis not present

## 2016-09-25 DIAGNOSIS — M25511 Pain in right shoulder: Secondary | ICD-10-CM | POA: Diagnosis not present

## 2016-09-26 DIAGNOSIS — M25511 Pain in right shoulder: Secondary | ICD-10-CM | POA: Diagnosis not present

## 2016-09-26 DIAGNOSIS — M75121 Complete rotator cuff tear or rupture of right shoulder, not specified as traumatic: Secondary | ICD-10-CM | POA: Diagnosis not present

## 2016-09-26 DIAGNOSIS — R531 Weakness: Secondary | ICD-10-CM | POA: Diagnosis not present

## 2016-10-03 DIAGNOSIS — M25511 Pain in right shoulder: Secondary | ICD-10-CM | POA: Diagnosis not present

## 2016-10-03 DIAGNOSIS — M75121 Complete rotator cuff tear or rupture of right shoulder, not specified as traumatic: Secondary | ICD-10-CM | POA: Diagnosis not present

## 2016-10-03 DIAGNOSIS — R531 Weakness: Secondary | ICD-10-CM | POA: Diagnosis not present

## 2016-10-05 DIAGNOSIS — M25511 Pain in right shoulder: Secondary | ICD-10-CM | POA: Diagnosis not present

## 2016-10-05 DIAGNOSIS — M75121 Complete rotator cuff tear or rupture of right shoulder, not specified as traumatic: Secondary | ICD-10-CM | POA: Diagnosis not present

## 2016-10-05 DIAGNOSIS — R531 Weakness: Secondary | ICD-10-CM | POA: Diagnosis not present

## 2016-10-08 DIAGNOSIS — M25511 Pain in right shoulder: Secondary | ICD-10-CM | POA: Diagnosis not present

## 2016-10-08 DIAGNOSIS — M75121 Complete rotator cuff tear or rupture of right shoulder, not specified as traumatic: Secondary | ICD-10-CM | POA: Diagnosis not present

## 2016-10-08 DIAGNOSIS — R531 Weakness: Secondary | ICD-10-CM | POA: Diagnosis not present

## 2016-10-11 DIAGNOSIS — M25511 Pain in right shoulder: Secondary | ICD-10-CM | POA: Diagnosis not present

## 2016-10-11 DIAGNOSIS — M75121 Complete rotator cuff tear or rupture of right shoulder, not specified as traumatic: Secondary | ICD-10-CM | POA: Diagnosis not present

## 2016-10-11 DIAGNOSIS — R531 Weakness: Secondary | ICD-10-CM | POA: Diagnosis not present

## 2016-10-15 DIAGNOSIS — M25511 Pain in right shoulder: Secondary | ICD-10-CM | POA: Diagnosis not present

## 2016-10-15 DIAGNOSIS — M75121 Complete rotator cuff tear or rupture of right shoulder, not specified as traumatic: Secondary | ICD-10-CM | POA: Diagnosis not present

## 2016-10-15 DIAGNOSIS — R531 Weakness: Secondary | ICD-10-CM | POA: Diagnosis not present

## 2016-10-18 DIAGNOSIS — M75121 Complete rotator cuff tear or rupture of right shoulder, not specified as traumatic: Secondary | ICD-10-CM | POA: Diagnosis not present

## 2016-10-18 DIAGNOSIS — M25511 Pain in right shoulder: Secondary | ICD-10-CM | POA: Diagnosis not present

## 2016-10-18 DIAGNOSIS — R531 Weakness: Secondary | ICD-10-CM | POA: Diagnosis not present

## 2016-10-22 DIAGNOSIS — M75121 Complete rotator cuff tear or rupture of right shoulder, not specified as traumatic: Secondary | ICD-10-CM | POA: Diagnosis not present

## 2016-10-22 DIAGNOSIS — R531 Weakness: Secondary | ICD-10-CM | POA: Diagnosis not present

## 2016-10-22 DIAGNOSIS — M25511 Pain in right shoulder: Secondary | ICD-10-CM | POA: Diagnosis not present

## 2016-10-25 DIAGNOSIS — M25511 Pain in right shoulder: Secondary | ICD-10-CM | POA: Diagnosis not present

## 2016-10-25 DIAGNOSIS — R531 Weakness: Secondary | ICD-10-CM | POA: Diagnosis not present

## 2016-10-25 DIAGNOSIS — M75121 Complete rotator cuff tear or rupture of right shoulder, not specified as traumatic: Secondary | ICD-10-CM | POA: Diagnosis not present

## 2016-10-29 DIAGNOSIS — M25511 Pain in right shoulder: Secondary | ICD-10-CM | POA: Diagnosis not present

## 2016-10-29 DIAGNOSIS — M75121 Complete rotator cuff tear or rupture of right shoulder, not specified as traumatic: Secondary | ICD-10-CM | POA: Diagnosis not present

## 2016-10-29 DIAGNOSIS — R531 Weakness: Secondary | ICD-10-CM | POA: Diagnosis not present

## 2017-01-16 DIAGNOSIS — Z23 Encounter for immunization: Secondary | ICD-10-CM | POA: Diagnosis not present

## 2017-01-24 DIAGNOSIS — H6123 Impacted cerumen, bilateral: Secondary | ICD-10-CM | POA: Diagnosis not present

## 2017-02-04 ENCOUNTER — Other Ambulatory Visit (HOSPITAL_COMMUNITY)
Admission: RE | Admit: 2017-02-04 | Discharge: 2017-02-04 | Disposition: A | Discharge status: Home / Self Care | Payer: Medicare Other | Source: Ambulatory Visit | Attending: Obstetrics & Gynecology | Admitting: Obstetrics & Gynecology

## 2017-02-04 ENCOUNTER — Ambulatory Visit (INDEPENDENT_AMBULATORY_CARE_PROVIDER_SITE_OTHER): Payer: Medicare Other | Admitting: Obstetrics & Gynecology

## 2017-02-04 ENCOUNTER — Encounter: Payer: Self-pay | Admitting: Obstetrics & Gynecology

## 2017-02-04 VITALS — BP 138/80 | HR 60 | Resp 14 | Ht 65.0 in | Wt 255.0 lb

## 2017-02-04 DIAGNOSIS — Z124 Encounter for screening for malignant neoplasm of cervix: Secondary | ICD-10-CM | POA: Insufficient documentation

## 2017-02-04 DIAGNOSIS — Z01419 Encounter for gynecological examination (general) (routine) without abnormal findings: Secondary | ICD-10-CM | POA: Diagnosis not present

## 2017-02-04 NOTE — Patient Instructions (Signed)
You may want to discuss the new shingles vaccines, Shingrix, with Dr. Maudie Mercury.

## 2017-02-04 NOTE — Progress Notes (Signed)
76 y.o. Christine Munoz here for annual exam.  Doing well.  Denies vaginal bleeding.    Still traveling a lot with her mother's history.  Mother is the main character in "Hidden Figures".    She just got her flu shot.  Has her blood work scheduled and appt with Dr. Maudie Mercury in early November.  No LMP recorded. Patient is postmenopausal.          Sexually active: No.  The current method of family planning is post menopausal status.    Exercising: Yes.    Walking when able Smoker:  Former smoker   Health Maintenance: Pap:  11/02/13 negative  History of abnormal Pap:  no MMG:  05/18/15 BIRADS 1 negative  Colonoscopy:  01/25/09- repeat 10 years- Dr. Benson Norway  BMD:   ~6 years ago with Dr. Minna Antis  TDaP:  PCP Pneumonia vaccine(s):  With PCP Zostavax:   Has done the Zostavax, d/w pt shingrix today Hep C testing: not indicated  Screening Labs: PCP, Hb today: PCP, Urine today: not collected   reports that she has quit smoking. She has never used smokeless tobacco. She reports that she drinks alcohol. She reports that she does not use drugs.  Past Medical History:  Diagnosis Date  . Anemia    h/o anemia  . Arthritis   . GERD (gastroesophageal reflux disease)    no meds  . Hyperlipidemia    borderline  . Leg abrasion    had to go to the wound center-? diag  . Shortness of breath    due to her weight- per pt    Past Surgical History:  Procedure Laterality Date  . CATARACT EXTRACTION     11/16/14 and 12/13/14 (Dr. Herbert Deaner)  . DILATATION & CURRETTAGE/HYSTEROSCOPY WITH RESECTOCOPE N/A 12/07/2013   Procedure: [place];  Surgeon: Lyman Speller, MD;  Location: [facility];  Service: Gynecology;  Laterality: N/A;  . FOOT SURGERY  1/16   hamer toe repair  . TONSILLECTOMY    . TUBAL LIGATION      Current Outpatient Prescriptions  Medication Sig Dispense Refill  . aspirin 81 MG tablet Take 81 mg by mouth daily.    . DUREZOL 0.05 % EMUL APPLY 1  DROP IN LEFT EYE THREE TIMES A DAY START AFTER SURGERY  1  . ibuprofen (ADVIL,MOTRIN) 200 MG tablet Take 400 mg by mouth every 6 (six) hours as needed for moderate pain.     Marland Kitchen ketorolac (ACULAR) 0.5 % ophthalmic solution APPLY 1 DROP IN LEFT EYE FOUR TIMES A DAY START 72 HOURS PRIOR TO SURGERY  1  . lisinopril (PRINIVIL,ZESTRIL) 5 MG tablet Take 5 mg by mouth daily.  12  . meloxicam (MOBIC) 15 MG tablet Take 15 mg by mouth daily as needed for pain.    . methocarbamol (ROBAXIN) 500 MG tablet Take 500 mg by mouth every 6 (six) hours.  3  . methylPREDNISolone (MEDROL) 4 MG TBPK tablet Tapering 6 day dose pack 21 tablet 0  . Multiple Vitamins-Minerals (MULTIVITAMIN PO) Take 1 tablet by mouth daily.     Marland Kitchen ofloxacin (OCUFLOX) 0.3 % ophthalmic solution INSTILL 1 DROP IN BOTH EYES FOUR TIMES A DAY START 72 HOURS PRIOR TO SURGERY  1  . traMADol (ULTRAM) 50 MG tablet Take 100 mg by mouth 2 (two) times daily as needed for moderate pain.     No current facility-administered medications for this visit.     Family History  Problem Relation Age  of Onset  . Liver cancer Sister   . Brain cancer Father        tumor  . Arthritis Mother   . Colon cancer Maternal Uncle   . Leukemia Maternal Uncle     ROS:  Pertinent items are noted in HPI.  Otherwise, a comprehensive ROS was negative.  Exam:   Vitals:   02/04/17 1236  BP: 138/80  Pulse: 60  Resp: 14    General appearance: alert, cooperative and appears stated age Head: Normocephalic, without obvious abnormality, atraumatic Neck: no adenopathy, supple, symmetrical, trachea midline and thyroid normal to inspection and palpation Lungs: clear to auscultation bilaterally Breasts: normal appearance, no masses or tenderness Heart: regular rate and rhythm Abdomen: soft, non-tender; bowel sounds normal; no masses,  no organomegaly Extremities: extremities normal, atraumatic, no cyanosis or edema Skin: Skin color, texture, turgor normal. No rashes or  lesions Lymph nodes: Cervical, supraclavicular, and axillary nodes normal. No abnormal inguinal nodes palpated Neurologic: Grossly normal   Pelvic: External genitalia:  no lesions              Urethra:  normal appearing urethra with no masses, tenderness or lesions              Bartholins and Skenes: normal                 Vagina: normal appearing vagina with normal color and discharge, no lesions              Cervix: no lesions              Pap taken: Yes.   Bimanual Exam:  Uterus:  normal size, contour, position, consistency, mobility, non-tender              Adnexa: normal adnexa and no mass, fullness, tenderness               Rectovaginal: Confirms               Anus:  normal sphincter tone, no lesions  Chaperone was present for exam.  A:  Well Woman with normal exam PMP, no HRT H/O polyp resection with D&C 2015 Knee arthritis   P:   Mammogram guidelines reviewed.  Pt aware she did not do one this year.  States she will scheduled it. pap smear obtained. Has lab work scheduled with Dr. Maudie Mercury D/w pt Shingrix vaccine.  She wants to discuss with this Dr. Maudie Mercury Return annually or prn

## 2017-02-06 LAB — CYTOLOGY - PAP: Diagnosis: NEGATIVE

## 2017-02-26 DIAGNOSIS — I10 Essential (primary) hypertension: Secondary | ICD-10-CM | POA: Diagnosis not present

## 2017-02-26 DIAGNOSIS — R739 Hyperglycemia, unspecified: Secondary | ICD-10-CM | POA: Diagnosis not present

## 2017-02-26 DIAGNOSIS — E7800 Pure hypercholesterolemia, unspecified: Secondary | ICD-10-CM | POA: Diagnosis not present

## 2017-02-26 DIAGNOSIS — E559 Vitamin D deficiency, unspecified: Secondary | ICD-10-CM | POA: Diagnosis not present

## 2017-03-05 DIAGNOSIS — R739 Hyperglycemia, unspecified: Secondary | ICD-10-CM | POA: Diagnosis not present

## 2017-03-05 DIAGNOSIS — E7800 Pure hypercholesterolemia, unspecified: Secondary | ICD-10-CM | POA: Diagnosis not present

## 2017-03-05 DIAGNOSIS — E559 Vitamin D deficiency, unspecified: Secondary | ICD-10-CM | POA: Diagnosis not present

## 2017-04-04 ENCOUNTER — Ambulatory Visit
Admission: RE | Admit: 2017-04-04 | Discharge: 2017-04-04 | Disposition: A | Discharge status: Home / Self Care | Payer: Medicare Other | Source: Ambulatory Visit | Attending: Internal Medicine | Admitting: Internal Medicine

## 2017-04-04 DIAGNOSIS — Z1231 Encounter for screening mammogram for malignant neoplasm of breast: Secondary | ICD-10-CM | POA: Diagnosis not present

## 2017-05-10 DIAGNOSIS — R0609 Other forms of dyspnea: Secondary | ICD-10-CM | POA: Diagnosis not present

## 2017-05-10 DIAGNOSIS — Z6841 Body Mass Index (BMI) 40.0 and over, adult: Secondary | ICD-10-CM | POA: Diagnosis not present

## 2017-05-14 ENCOUNTER — Other Ambulatory Visit (HOSPITAL_COMMUNITY): Payer: Self-pay | Admitting: Respiratory Therapy

## 2017-05-14 DIAGNOSIS — R0600 Dyspnea, unspecified: Secondary | ICD-10-CM

## 2017-05-14 DIAGNOSIS — J441 Chronic obstructive pulmonary disease with (acute) exacerbation: Secondary | ICD-10-CM

## 2017-05-20 ENCOUNTER — Ambulatory Visit (HOSPITAL_COMMUNITY)
Admission: RE | Admit: 2017-05-20 | Discharge: 2017-05-20 | Disposition: A | Discharge status: Home / Self Care | Payer: Medicare HMO | Source: Ambulatory Visit | Attending: Internal Medicine | Admitting: Internal Medicine

## 2017-05-20 DIAGNOSIS — J441 Chronic obstructive pulmonary disease with (acute) exacerbation: Secondary | ICD-10-CM | POA: Diagnosis present

## 2017-05-20 DIAGNOSIS — R0600 Dyspnea, unspecified: Secondary | ICD-10-CM | POA: Insufficient documentation

## 2017-05-20 DIAGNOSIS — I998 Other disorder of circulatory system: Secondary | ICD-10-CM | POA: Insufficient documentation

## 2017-05-20 LAB — PULMONARY FUNCTION TEST
DL/VA % pred: 89 %
DL/VA: 4.4 ml/min/mmHg/L
DLCO unc % pred: 57 %
DLCO unc: 14.63 ml/min/mmHg
FEF 25-75 Post: 3.31 L/sec
FEF 25-75 Pre: 2.82 L/sec
FEF2575-%Change-Post: 17 %
FEF2575-%Pred-Post: 209 %
FEF2575-%Pred-Pre: 178 %
FEV1-%Change-Post: 4 %
FEV1-%Pred-Post: 96 %
FEV1-%Pred-Pre: 92 %
FEV1-Post: 1.74 L
FEV1-Pre: 1.67 L
FEV1FVC-%Change-Post: 3 %
FEV1FVC-%Pred-Pre: 118 %
FEV6-%Change-Post: 1 %
FEV6-%Pred-Post: 84 %
FEV6-%Pred-Pre: 83 %
FEV6-Post: 1.88 L
FEV6-Pre: 1.86 L
FEV6FVC-%Pred-Post: 104 %
FEV6FVC-%Pred-Pre: 104 %
FVC-%Change-Post: 1 %
FVC-%Pred-Post: 81 %
FVC-%Pred-Pre: 80 %
FVC-Post: 1.88 L
FVC-Pre: 1.86 L
Post FEV1/FVC ratio: 93 %
Post FEV6/FVC ratio: 100 %
Pre FEV1/FVC ratio: 90 %
Pre FEV6/FVC Ratio: 100 %
RV % pred: 91 %
RV: 2.14 L
TLC % pred: 83 %
TLC: 4.34 L

## 2017-05-20 MED ORDER — ALBUTEROL SULFATE (2.5 MG/3ML) 0.083% IN NEBU
2.5000 mg | INHALATION_SOLUTION | Freq: Once | RESPIRATORY_TRACT | Status: AC
  Administered 2017-05-20: 2.5 mg via RESPIRATORY_TRACT

## 2017-05-29 DIAGNOSIS — E559 Vitamin D deficiency, unspecified: Secondary | ICD-10-CM | POA: Diagnosis not present

## 2017-05-29 DIAGNOSIS — R7989 Other specified abnormal findings of blood chemistry: Secondary | ICD-10-CM | POA: Diagnosis not present

## 2017-05-29 DIAGNOSIS — E7800 Pure hypercholesterolemia, unspecified: Secondary | ICD-10-CM | POA: Diagnosis not present

## 2017-05-29 DIAGNOSIS — R739 Hyperglycemia, unspecified: Secondary | ICD-10-CM | POA: Diagnosis not present

## 2017-05-30 DIAGNOSIS — Z0189 Encounter for other specified special examinations: Secondary | ICD-10-CM | POA: Diagnosis not present

## 2017-05-30 DIAGNOSIS — I10 Essential (primary) hypertension: Secondary | ICD-10-CM | POA: Diagnosis not present

## 2017-05-30 DIAGNOSIS — R600 Localized edema: Secondary | ICD-10-CM | POA: Diagnosis not present

## 2017-05-30 DIAGNOSIS — R0609 Other forms of dyspnea: Secondary | ICD-10-CM | POA: Diagnosis not present

## 2017-06-05 DIAGNOSIS — I10 Essential (primary) hypertension: Secondary | ICD-10-CM | POA: Diagnosis not present

## 2017-06-05 DIAGNOSIS — E559 Vitamin D deficiency, unspecified: Secondary | ICD-10-CM | POA: Diagnosis not present

## 2017-06-05 DIAGNOSIS — Z23 Encounter for immunization: Secondary | ICD-10-CM | POA: Diagnosis not present

## 2017-06-05 DIAGNOSIS — E7800 Pure hypercholesterolemia, unspecified: Secondary | ICD-10-CM | POA: Diagnosis not present

## 2017-06-07 DIAGNOSIS — R600 Localized edema: Secondary | ICD-10-CM | POA: Diagnosis not present

## 2017-06-07 DIAGNOSIS — R0602 Shortness of breath: Secondary | ICD-10-CM | POA: Diagnosis not present

## 2017-06-07 DIAGNOSIS — E7800 Pure hypercholesterolemia, unspecified: Secondary | ICD-10-CM | POA: Diagnosis not present

## 2017-06-07 DIAGNOSIS — I10 Essential (primary) hypertension: Secondary | ICD-10-CM | POA: Diagnosis not present

## 2017-06-11 DIAGNOSIS — R600 Localized edema: Secondary | ICD-10-CM | POA: Diagnosis not present

## 2017-06-11 DIAGNOSIS — E7800 Pure hypercholesterolemia, unspecified: Secondary | ICD-10-CM | POA: Diagnosis not present

## 2017-06-11 DIAGNOSIS — I10 Essential (primary) hypertension: Secondary | ICD-10-CM | POA: Diagnosis not present

## 2017-06-19 DIAGNOSIS — I5030 Unspecified diastolic (congestive) heart failure: Secondary | ICD-10-CM | POA: Diagnosis not present

## 2017-06-19 DIAGNOSIS — I272 Pulmonary hypertension, unspecified: Secondary | ICD-10-CM | POA: Diagnosis not present

## 2017-06-19 DIAGNOSIS — R0609 Other forms of dyspnea: Secondary | ICD-10-CM | POA: Diagnosis not present

## 2017-06-19 DIAGNOSIS — I10 Essential (primary) hypertension: Secondary | ICD-10-CM | POA: Diagnosis not present

## 2017-09-12 DIAGNOSIS — I5030 Unspecified diastolic (congestive) heart failure: Secondary | ICD-10-CM | POA: Diagnosis not present

## 2017-09-12 DIAGNOSIS — Z0189 Encounter for other specified special examinations: Secondary | ICD-10-CM | POA: Diagnosis not present

## 2017-09-12 DIAGNOSIS — I10 Essential (primary) hypertension: Secondary | ICD-10-CM | POA: Diagnosis not present

## 2017-09-20 DIAGNOSIS — H35363 Drusen (degenerative) of macula, bilateral: Secondary | ICD-10-CM | POA: Diagnosis not present

## 2017-09-20 DIAGNOSIS — H26491 Other secondary cataract, right eye: Secondary | ICD-10-CM | POA: Diagnosis not present

## 2017-09-20 DIAGNOSIS — Z961 Presence of intraocular lens: Secondary | ICD-10-CM | POA: Diagnosis not present

## 2017-09-20 DIAGNOSIS — H04123 Dry eye syndrome of bilateral lacrimal glands: Secondary | ICD-10-CM | POA: Diagnosis not present

## 2017-09-27 DIAGNOSIS — I10 Essential (primary) hypertension: Secondary | ICD-10-CM | POA: Diagnosis not present

## 2017-11-06 DIAGNOSIS — I5030 Unspecified diastolic (congestive) heart failure: Secondary | ICD-10-CM | POA: Diagnosis not present

## 2017-11-06 DIAGNOSIS — Z0189 Encounter for other specified special examinations: Secondary | ICD-10-CM | POA: Diagnosis not present

## 2017-11-06 DIAGNOSIS — I10 Essential (primary) hypertension: Secondary | ICD-10-CM | POA: Diagnosis not present

## 2017-11-25 DIAGNOSIS — M6281 Muscle weakness (generalized): Secondary | ICD-10-CM | POA: Diagnosis not present

## 2017-11-25 DIAGNOSIS — M25562 Pain in left knee: Secondary | ICD-10-CM | POA: Diagnosis not present

## 2017-11-25 DIAGNOSIS — R262 Difficulty in walking, not elsewhere classified: Secondary | ICD-10-CM | POA: Diagnosis not present

## 2017-11-25 DIAGNOSIS — M25561 Pain in right knee: Secondary | ICD-10-CM | POA: Diagnosis not present

## 2017-11-28 DIAGNOSIS — M25562 Pain in left knee: Secondary | ICD-10-CM | POA: Diagnosis not present

## 2017-11-28 DIAGNOSIS — M25561 Pain in right knee: Secondary | ICD-10-CM | POA: Diagnosis not present

## 2017-11-28 DIAGNOSIS — R262 Difficulty in walking, not elsewhere classified: Secondary | ICD-10-CM | POA: Diagnosis not present

## 2017-11-28 DIAGNOSIS — M6281 Muscle weakness (generalized): Secondary | ICD-10-CM | POA: Diagnosis not present

## 2017-12-02 DIAGNOSIS — M25562 Pain in left knee: Secondary | ICD-10-CM | POA: Diagnosis not present

## 2017-12-02 DIAGNOSIS — M25561 Pain in right knee: Secondary | ICD-10-CM | POA: Diagnosis not present

## 2017-12-02 DIAGNOSIS — M6281 Muscle weakness (generalized): Secondary | ICD-10-CM | POA: Diagnosis not present

## 2017-12-02 DIAGNOSIS — R262 Difficulty in walking, not elsewhere classified: Secondary | ICD-10-CM | POA: Diagnosis not present

## 2017-12-04 DIAGNOSIS — M25561 Pain in right knee: Secondary | ICD-10-CM | POA: Diagnosis not present

## 2017-12-04 DIAGNOSIS — M6281 Muscle weakness (generalized): Secondary | ICD-10-CM | POA: Diagnosis not present

## 2017-12-04 DIAGNOSIS — R262 Difficulty in walking, not elsewhere classified: Secondary | ICD-10-CM | POA: Diagnosis not present

## 2017-12-04 DIAGNOSIS — M25562 Pain in left knee: Secondary | ICD-10-CM | POA: Diagnosis not present

## 2017-12-09 DIAGNOSIS — M6281 Muscle weakness (generalized): Secondary | ICD-10-CM | POA: Diagnosis not present

## 2017-12-09 DIAGNOSIS — M25561 Pain in right knee: Secondary | ICD-10-CM | POA: Diagnosis not present

## 2017-12-09 DIAGNOSIS — M25562 Pain in left knee: Secondary | ICD-10-CM | POA: Diagnosis not present

## 2017-12-09 DIAGNOSIS — R262 Difficulty in walking, not elsewhere classified: Secondary | ICD-10-CM | POA: Diagnosis not present

## 2017-12-11 DIAGNOSIS — M25562 Pain in left knee: Secondary | ICD-10-CM | POA: Diagnosis not present

## 2017-12-11 DIAGNOSIS — R262 Difficulty in walking, not elsewhere classified: Secondary | ICD-10-CM | POA: Diagnosis not present

## 2017-12-11 DIAGNOSIS — M25561 Pain in right knee: Secondary | ICD-10-CM | POA: Diagnosis not present

## 2017-12-11 DIAGNOSIS — M6281 Muscle weakness (generalized): Secondary | ICD-10-CM | POA: Diagnosis not present

## 2017-12-25 DIAGNOSIS — M25561 Pain in right knee: Secondary | ICD-10-CM | POA: Diagnosis not present

## 2017-12-25 DIAGNOSIS — M6281 Muscle weakness (generalized): Secondary | ICD-10-CM | POA: Diagnosis not present

## 2017-12-25 DIAGNOSIS — R262 Difficulty in walking, not elsewhere classified: Secondary | ICD-10-CM | POA: Diagnosis not present

## 2017-12-25 DIAGNOSIS — M25562 Pain in left knee: Secondary | ICD-10-CM | POA: Diagnosis not present

## 2017-12-27 DIAGNOSIS — M25562 Pain in left knee: Secondary | ICD-10-CM | POA: Diagnosis not present

## 2017-12-27 DIAGNOSIS — M25561 Pain in right knee: Secondary | ICD-10-CM | POA: Diagnosis not present

## 2017-12-27 DIAGNOSIS — R262 Difficulty in walking, not elsewhere classified: Secondary | ICD-10-CM | POA: Diagnosis not present

## 2017-12-27 DIAGNOSIS — M6281 Muscle weakness (generalized): Secondary | ICD-10-CM | POA: Diagnosis not present

## 2017-12-31 DIAGNOSIS — R262 Difficulty in walking, not elsewhere classified: Secondary | ICD-10-CM | POA: Diagnosis not present

## 2017-12-31 DIAGNOSIS — M25561 Pain in right knee: Secondary | ICD-10-CM | POA: Diagnosis not present

## 2017-12-31 DIAGNOSIS — M25562 Pain in left knee: Secondary | ICD-10-CM | POA: Diagnosis not present

## 2017-12-31 DIAGNOSIS — M6281 Muscle weakness (generalized): Secondary | ICD-10-CM | POA: Diagnosis not present

## 2018-01-02 DIAGNOSIS — R262 Difficulty in walking, not elsewhere classified: Secondary | ICD-10-CM | POA: Diagnosis not present

## 2018-01-02 DIAGNOSIS — M25562 Pain in left knee: Secondary | ICD-10-CM | POA: Diagnosis not present

## 2018-01-02 DIAGNOSIS — M6281 Muscle weakness (generalized): Secondary | ICD-10-CM | POA: Diagnosis not present

## 2018-01-02 DIAGNOSIS — M25561 Pain in right knee: Secondary | ICD-10-CM | POA: Diagnosis not present

## 2018-01-06 DIAGNOSIS — R262 Difficulty in walking, not elsewhere classified: Secondary | ICD-10-CM | POA: Diagnosis not present

## 2018-01-06 DIAGNOSIS — M25561 Pain in right knee: Secondary | ICD-10-CM | POA: Diagnosis not present

## 2018-01-06 DIAGNOSIS — M6281 Muscle weakness (generalized): Secondary | ICD-10-CM | POA: Diagnosis not present

## 2018-01-06 DIAGNOSIS — M25562 Pain in left knee: Secondary | ICD-10-CM | POA: Diagnosis not present

## 2018-01-08 DIAGNOSIS — R262 Difficulty in walking, not elsewhere classified: Secondary | ICD-10-CM | POA: Diagnosis not present

## 2018-01-08 DIAGNOSIS — M25562 Pain in left knee: Secondary | ICD-10-CM | POA: Diagnosis not present

## 2018-01-08 DIAGNOSIS — M6281 Muscle weakness (generalized): Secondary | ICD-10-CM | POA: Diagnosis not present

## 2018-01-08 DIAGNOSIS — M25561 Pain in right knee: Secondary | ICD-10-CM | POA: Diagnosis not present

## 2018-01-13 DIAGNOSIS — M6281 Muscle weakness (generalized): Secondary | ICD-10-CM | POA: Diagnosis not present

## 2018-01-13 DIAGNOSIS — M25562 Pain in left knee: Secondary | ICD-10-CM | POA: Diagnosis not present

## 2018-01-13 DIAGNOSIS — R262 Difficulty in walking, not elsewhere classified: Secondary | ICD-10-CM | POA: Diagnosis not present

## 2018-01-13 DIAGNOSIS — M25561 Pain in right knee: Secondary | ICD-10-CM | POA: Diagnosis not present

## 2018-01-15 DIAGNOSIS — M25561 Pain in right knee: Secondary | ICD-10-CM | POA: Diagnosis not present

## 2018-01-15 DIAGNOSIS — M6281 Muscle weakness (generalized): Secondary | ICD-10-CM | POA: Diagnosis not present

## 2018-01-15 DIAGNOSIS — R262 Difficulty in walking, not elsewhere classified: Secondary | ICD-10-CM | POA: Diagnosis not present

## 2018-01-15 DIAGNOSIS — M25562 Pain in left knee: Secondary | ICD-10-CM | POA: Diagnosis not present

## 2018-01-22 DIAGNOSIS — M25562 Pain in left knee: Secondary | ICD-10-CM | POA: Diagnosis not present

## 2018-01-22 DIAGNOSIS — M25561 Pain in right knee: Secondary | ICD-10-CM | POA: Diagnosis not present

## 2018-01-22 DIAGNOSIS — R262 Difficulty in walking, not elsewhere classified: Secondary | ICD-10-CM | POA: Diagnosis not present

## 2018-01-22 DIAGNOSIS — M6281 Muscle weakness (generalized): Secondary | ICD-10-CM | POA: Diagnosis not present

## 2018-01-28 DIAGNOSIS — R262 Difficulty in walking, not elsewhere classified: Secondary | ICD-10-CM | POA: Diagnosis not present

## 2018-01-28 DIAGNOSIS — M6281 Muscle weakness (generalized): Secondary | ICD-10-CM | POA: Diagnosis not present

## 2018-01-28 DIAGNOSIS — M25561 Pain in right knee: Secondary | ICD-10-CM | POA: Diagnosis not present

## 2018-01-28 DIAGNOSIS — R69 Illness, unspecified: Secondary | ICD-10-CM | POA: Diagnosis not present

## 2018-01-28 DIAGNOSIS — M25562 Pain in left knee: Secondary | ICD-10-CM | POA: Diagnosis not present

## 2018-01-30 DIAGNOSIS — M25562 Pain in left knee: Secondary | ICD-10-CM | POA: Diagnosis not present

## 2018-01-30 DIAGNOSIS — M25561 Pain in right knee: Secondary | ICD-10-CM | POA: Diagnosis not present

## 2018-01-30 DIAGNOSIS — R262 Difficulty in walking, not elsewhere classified: Secondary | ICD-10-CM | POA: Diagnosis not present

## 2018-01-30 DIAGNOSIS — M6281 Muscle weakness (generalized): Secondary | ICD-10-CM | POA: Diagnosis not present

## 2018-02-04 DIAGNOSIS — M25561 Pain in right knee: Secondary | ICD-10-CM | POA: Diagnosis not present

## 2018-02-04 DIAGNOSIS — M25562 Pain in left knee: Secondary | ICD-10-CM | POA: Diagnosis not present

## 2018-02-04 DIAGNOSIS — R262 Difficulty in walking, not elsewhere classified: Secondary | ICD-10-CM | POA: Diagnosis not present

## 2018-02-04 DIAGNOSIS — M6281 Muscle weakness (generalized): Secondary | ICD-10-CM | POA: Diagnosis not present

## 2018-02-11 DIAGNOSIS — M6281 Muscle weakness (generalized): Secondary | ICD-10-CM | POA: Diagnosis not present

## 2018-02-11 DIAGNOSIS — M25562 Pain in left knee: Secondary | ICD-10-CM | POA: Diagnosis not present

## 2018-02-11 DIAGNOSIS — R262 Difficulty in walking, not elsewhere classified: Secondary | ICD-10-CM | POA: Diagnosis not present

## 2018-02-11 DIAGNOSIS — M25561 Pain in right knee: Secondary | ICD-10-CM | POA: Diagnosis not present

## 2018-02-24 ENCOUNTER — Other Ambulatory Visit: Payer: Self-pay | Admitting: Internal Medicine

## 2018-02-24 DIAGNOSIS — Z1231 Encounter for screening mammogram for malignant neoplasm of breast: Secondary | ICD-10-CM

## 2018-03-17 ENCOUNTER — Encounter: Payer: Self-pay | Admitting: Obstetrics & Gynecology

## 2018-03-17 ENCOUNTER — Ambulatory Visit: Payer: Medicare HMO | Admitting: Obstetrics & Gynecology

## 2018-03-17 ENCOUNTER — Other Ambulatory Visit: Payer: Self-pay

## 2018-03-17 VITALS — BP 158/98 | HR 80 | Resp 16 | Ht 65.25 in | Wt 257.6 lb

## 2018-03-17 DIAGNOSIS — Z01419 Encounter for gynecological examination (general) (routine) without abnormal findings: Secondary | ICD-10-CM | POA: Diagnosis not present

## 2018-03-17 DIAGNOSIS — I10 Essential (primary) hypertension: Secondary | ICD-10-CM

## 2018-03-17 NOTE — Patient Instructions (Signed)
Grier Mitts, MD [facility], [place], [place] [address]  Phone: [phone]

## 2018-03-17 NOTE — Progress Notes (Signed)
77 y.o. [place] or [place] American female here for annual exam.  Doing well.  Denies vaginal bleeding.  Mother is 101.  Father died in 2022-08-16.  He was 93.  They lived in [place].    Denies vaginal bleeding.    Frustrated with weight.    PCP:  Dr. Arlys John.    No LMP recorded. Patient is postmenopausal.          Sexually active: No.  The current method of family planning is post menopausal status.    Exercising: Yes.    gym Smoker:  no  Health Maintenance: Pap:  02/04/17 Neg   11/02/13 Neg  History of abnormal Pap:  no MMG:  04/04/17 BIRADS1:neg. Has appt scheduled  Colonoscopy:  01/25/09 Normal. F/u 10 years  BMD: PCP TDaP: PCP Pneumonia vaccine(s):  PCP Shingrix:   PCP Hep C testing: n/a Screening Labs: PCP   reports that she has quit smoking. She has never used smokeless tobacco. She reports that she drinks alcohol. She reports that she does not use drugs.  Past Medical History:  Diagnosis Date  . Anemia    h/o anemia  . Arthritis   . GERD (gastroesophageal reflux disease)    no meds  . Hyperlipidemia    borderline  . Leg abrasion    had to go to the wound center-? diag  . Shortness of breath    due to her weight- per pt    Past Surgical History:  Procedure Laterality Date  . CATARACT EXTRACTION     11/16/14 and 12/13/14 (Dr. Herbert Deaner)  . DILATATION & CURRETTAGE/HYSTEROSCOPY WITH RESECTOCOPE N/A 12/07/2013   Procedure: [place];  Surgeon: Lyman Speller, MD;  Location: [facility];  Service: Gynecology;  Laterality: N/A;  . FOOT SURGERY  1/16   hamer toe repair  . ROTATOR CUFF REPAIR Right 05/24/2015  . TONSILLECTOMY    . TUBAL LIGATION      Current Outpatient Medications  Medication Sig Dispense Refill  . Acetaminophen (TYLENOL EXTRA STRENGTH PO) Take by mouth.    . Cholecalciferol (VITAMIN D PO) Take 4,000 Int'l Units by mouth daily.    . furosemide (LASIX) 20 MG tablet daily as needed.    Marland Kitchen  losartan (COZAAR) 100 MG tablet TAKE 1/2 TABLET ONCE A DAY ORAL 30  5  . meloxicam (MOBIC) 15 MG tablet Take 15 mg by mouth daily as needed for pain.    . Multiple Vitamins-Minerals (MULTIVITAMIN PO) Take 1 tablet by mouth daily.     . Omega-3 Fatty Acids (FISH OIL) 1000 MG CAPS Take by mouth.    . Turmeric 400 MG CAPS Take by mouth.    . Vitamin D, Ergocalciferol, (DRISDOL) 50000 units CAPS capsule TAKE 1 CAPSULE ONCE A WEEK ORALLY 30 DAY(S)  11   No current facility-administered medications for this visit.     Family History  Problem Relation Age of Onset  . Liver cancer Sister   . Brain cancer Father        tumor  . Arthritis Mother   . Colon cancer Maternal Uncle   . Leukemia Maternal Uncle     Review of Systems  All other systems reviewed and are negative.   Exam:   BP (!) 158/98 (BP Location: Left Arm, Patient Position: Sitting, Cuff Size: Large)   Pulse 80   Resp 16   Ht 5' 5.25" (1.657 m)   Wt 257 lb 9.6 oz (116.8 kg)  BMI 42.54 kg/m    Height: 5' 5.25" (165.7 cm)  Ht Readings from Last 3 Encounters:  03/17/18 5' 5.25" (1.657 m)  02/04/17 5\' 5"  (1.651 m)  01/10/15 5' 5.75" (1.67 m)    General appearance: alert, cooperative and appears stated age Head: Normocephalic, without obvious abnormality, atraumatic Neck: no adenopathy, supple, symmetrical, trachea midline and thyroid normal to inspection and palpation Lungs: clear to auscultation bilaterally Breasts: normal appearance, no masses or tenderness Heart: regular rate and rhythm Abdomen: soft, non-tender; bowel sounds normal; no masses,  no organomegaly Extremities: extremities normal, atraumatic, no cyanosis or edema Skin: Skin color, texture, turgor normal. No rashes or lesions Lymph nodes: Cervical, supraclavicular, and axillary nodes normal. No abnormal inguinal nodes palpated Neurologic: Grossly normal   Pelvic: External genitalia:  no lesions              Urethra:  normal appearing urethra with no  masses, tenderness or lesions              Bartholins and Skenes: normal                 Vagina: normal appearing vagina with normal color and discharge, no lesions              Cervix: no lesions              Pap taken: No. Bimanual Exam:  Uterus:  normal size, contour, position, consistency, mobility, non-tender              Adnexa: normal adnexa and no mass, fullness, tenderness               Rectovaginal: Confirms               Anus:  normal sphincter tone, no lesions  Chaperone was present for exam.  A:  Well Woman with normal exam PMP, no HRT H/o polyp resection with D&C 2015 Arthritis Hypertension  P:   Mammogram guidelines reviewed.  Has appt scheduled. Release of records from Dr. Julianne Rice office signed today--last labs, last BMD, vaccines pap smear not indicated Colonoscopy will be due next year Will help with establishing care with new PCP return annually or prn

## 2018-04-08 ENCOUNTER — Ambulatory Visit
Admission: RE | Admit: 2018-04-08 | Discharge: 2018-04-08 | Disposition: A | Discharge status: Home / Self Care | Payer: Medicare HMO | Source: Ambulatory Visit | Attending: Internal Medicine | Admitting: Internal Medicine

## 2018-04-08 DIAGNOSIS — Z1231 Encounter for screening mammogram for malignant neoplasm of breast: Secondary | ICD-10-CM | POA: Diagnosis not present

## 2018-04-11 ENCOUNTER — Ambulatory Visit: Payer: Medicare Other | Admitting: Obstetrics & Gynecology

## 2018-04-11 ENCOUNTER — Encounter

## 2018-05-21 DIAGNOSIS — E7800 Pure hypercholesterolemia, unspecified: Secondary | ICD-10-CM | POA: Diagnosis not present

## 2018-05-21 DIAGNOSIS — I10 Essential (primary) hypertension: Secondary | ICD-10-CM | POA: Diagnosis not present

## 2018-05-21 DIAGNOSIS — Z23 Encounter for immunization: Secondary | ICD-10-CM | POA: Diagnosis not present

## 2018-05-21 DIAGNOSIS — K219 Gastro-esophageal reflux disease without esophagitis: Secondary | ICD-10-CM | POA: Diagnosis not present

## 2018-08-15 DIAGNOSIS — E559 Vitamin D deficiency, unspecified: Secondary | ICD-10-CM | POA: Diagnosis not present

## 2018-08-15 DIAGNOSIS — I10 Essential (primary) hypertension: Secondary | ICD-10-CM | POA: Diagnosis not present

## 2018-08-15 DIAGNOSIS — Z0000 Encounter for general adult medical examination without abnormal findings: Secondary | ICD-10-CM | POA: Diagnosis not present

## 2018-08-15 DIAGNOSIS — E7800 Pure hypercholesterolemia, unspecified: Secondary | ICD-10-CM | POA: Diagnosis not present

## 2018-08-21 DIAGNOSIS — Z7189 Other specified counseling: Secondary | ICD-10-CM | POA: Diagnosis not present

## 2018-08-21 DIAGNOSIS — R7309 Other abnormal glucose: Secondary | ICD-10-CM | POA: Diagnosis not present

## 2018-08-21 DIAGNOSIS — E559 Vitamin D deficiency, unspecified: Secondary | ICD-10-CM | POA: Diagnosis not present

## 2018-08-21 DIAGNOSIS — E7800 Pure hypercholesterolemia, unspecified: Secondary | ICD-10-CM | POA: Diagnosis not present

## 2018-08-21 DIAGNOSIS — R2681 Unsteadiness on feet: Secondary | ICD-10-CM | POA: Diagnosis not present

## 2018-08-21 DIAGNOSIS — M5136 Other intervertebral disc degeneration, lumbar region: Secondary | ICD-10-CM | POA: Diagnosis not present

## 2018-08-21 DIAGNOSIS — K219 Gastro-esophageal reflux disease without esophagitis: Secondary | ICD-10-CM | POA: Diagnosis not present

## 2018-08-21 DIAGNOSIS — I10 Essential (primary) hypertension: Secondary | ICD-10-CM | POA: Diagnosis not present

## 2018-09-26 DIAGNOSIS — H35363 Drusen (degenerative) of macula, bilateral: Secondary | ICD-10-CM | POA: Diagnosis not present

## 2018-09-26 DIAGNOSIS — Z961 Presence of intraocular lens: Secondary | ICD-10-CM | POA: Diagnosis not present

## 2018-09-26 DIAGNOSIS — H524 Presbyopia: Secondary | ICD-10-CM | POA: Diagnosis not present

## 2018-09-26 DIAGNOSIS — H43393 Other vitreous opacities, bilateral: Secondary | ICD-10-CM | POA: Diagnosis not present

## 2018-09-26 DIAGNOSIS — H26493 Other secondary cataract, bilateral: Secondary | ICD-10-CM | POA: Diagnosis not present

## 2018-10-17 DIAGNOSIS — Z20828 Contact with and (suspected) exposure to other viral communicable diseases: Secondary | ICD-10-CM | POA: Diagnosis not present

## 2018-10-29 ENCOUNTER — Other Ambulatory Visit: Payer: Self-pay

## 2018-10-29 ENCOUNTER — Ambulatory Visit (INDEPENDENT_AMBULATORY_CARE_PROVIDER_SITE_OTHER): Payer: Medicare HMO | Admitting: Cardiology

## 2018-10-29 ENCOUNTER — Encounter: Payer: Self-pay | Admitting: Cardiology

## 2018-10-29 VITALS — BP 158/84 | HR 65

## 2018-10-29 DIAGNOSIS — I10 Essential (primary) hypertension: Secondary | ICD-10-CM

## 2018-10-29 DIAGNOSIS — E782 Mixed hyperlipidemia: Secondary | ICD-10-CM | POA: Diagnosis not present

## 2018-10-29 MED ORDER — AMLODIPINE BESYLATE 5 MG PO TABS: 5.0000 mg | ORAL_TABLET | Freq: Every day | ORAL | 3 refills | Status: DC

## 2018-10-29 NOTE — Progress Notes (Signed)
Virtual Visit via Video Note   Subjective:   Christine Munoz, female    DOB: 06/07/1940, 78 y.o.   MRN: [id number]   I connected with the patient on 10/29/18 by a video enabled telemedicine application and verified that I am speaking with the correct person using two identifiers.     I discussed the limitations of evaluation and management by telemedicine and the availability of in person appointments. The patient expressed understanding and agreed to proceed.   This visit type was conducted due to national recommendations for restrictions regarding the COVID-19 Pandemic (e.g. social distancing).  This format is felt to be most appropriate for this patient at this time.  All issues noted in this document were discussed and addressed.  No physical exam was performed (except for noted visual exam findings with Tele health visits).  The patient has consented to conduct a Tele health visit and understands insurance will be billed.     Chief complaint:  Hypertension   HPI  78 year old African-American female with hypertension, morbid obesity, exertional dyspnea likely due to heart failure with preserved ejection fraction, stress test negative for ischemia.  Patient has had some family stressors lately with passing of her mother and health issues with her son. She is recovering from these. She does some walking around [place] ranch [place]. She has stable, unchanged shortness of breath with more than usual exertion. She denies any chest pain. Leg edema has resolved.  Blood pressure stays elevated. She is currently on losartan 100 mg daily. She recently underwent labwork through PCP  Dr. Theda Sers. I do not have the results available to me.   Past Medical History:  Diagnosis Date  . Anemia    h/o anemia  . Arthritis   . GERD (gastroesophageal reflux disease)    no meds  . Hyperlipidemia    borderline  . Leg abrasion    had to go to the wound center-? diag  . Shortness of breath    due to her weight- per pt     Past Surgical History:  Procedure Laterality Date  . CATARACT EXTRACTION     11/16/14 and 12/13/14 (Dr. Herbert Deaner)  . DILATATION & CURRETTAGE/HYSTEROSCOPY WITH RESECTOCOPE N/A 12/07/2013   Procedure: [place];  Surgeon: Lyman Speller, MD;  Location: [facility];  Service: Gynecology;  Laterality: N/A;  . FOOT SURGERY  1/16   hamer toe repair  . ROTATOR CUFF REPAIR Right 05/24/2015  . TONSILLECTOMY    . TUBAL LIGATION       Social History   Socioeconomic History  . Marital status: Single    Spouse name: Not on file  . Number of children: Not on file  . Years of education: Not on file  . Highest education level: Not on file  Occupational History  . Not on file  Social Needs  . Financial resource strain: Not on file  . Food insecurity    Worry: Not on file    Inability: Not on file  . Transportation needs    Medical: Not on file    Non-medical: Not on file  Tobacco Use  . Smoking status: Former Research scientist (life sciences)  . Smokeless tobacco: Never Used  . Tobacco comment: years ago-not heavy/quit 2003/2004  Substance and Sexual Activity  . Alcohol use: Yes    Alcohol/week: 0.0 - 1.0 standard drinks  . Drug use: No  . Sexual activity: Not Currently    Partners: Male  Lifestyle  . Physical  activity    Days per week: Not on file    Minutes per session: Not on file  . Stress: Not on file  Relationships  . Social Herbalist on phone: Not on file    Gets together: Not on file    Attends religious service: Not on file    Active member of club or organization: Not on file    Attends meetings of clubs or organizations: Not on file    Relationship status: Not on file  . Intimate partner violence    Fear of current or ex partner: Not on file    Emotionally abused: Not on file    Physically abused: Not on file    Forced sexual activity: Not on file  Other Topics Concern  . Not on file  Social History  Narrative  . Not on file     Family History  Problem Relation Age of Onset  . Liver cancer Sister   . Brain cancer Father        tumor  . Arthritis Mother   . Colon cancer Maternal Uncle   . Leukemia Maternal Uncle      Current Outpatient Medications on File Prior to Visit  Medication Sig Dispense Refill  . Acetaminophen (TYLENOL EXTRA STRENGTH PO) Take by mouth.    . Cholecalciferol (VITAMIN D PO) Take 4,000 Int'l Units by mouth daily.    . furosemide (LASIX) 20 MG tablet daily as needed.    Marland Kitchen losartan (COZAAR) 100 MG tablet TAKE 1/2 TABLET ONCE A DAY ORAL 30  5  . meloxicam (MOBIC) 15 MG tablet Take 15 mg by mouth daily as needed for pain.    . Multiple Vitamins-Minerals (MULTIVITAMIN PO) Take 1 tablet by mouth daily.     . Omega-3 Fatty Acids (FISH OIL) 1000 MG CAPS Take by mouth.    . Turmeric 400 MG CAPS Take by mouth.    . Vitamin D, Ergocalciferol, (DRISDOL) 50000 units CAPS capsule TAKE 1 CAPSULE ONCE A WEEK ORALLY 30 DAY(S)  11   No current facility-administered medications on file prior to visit.     Cardiovascular studies:  Echocardiogram 06/11/2017: Left ventricle cavity is normal in size. Mild concentric hypertrophy of the left ventricle. Normal global wall motion. Doppler evidence of grade II (pseudonormal) diastolic dysfunction. Diastolic dysfunction findings suggests elevated LA/LV end diastolic pressure. Calculated EF 61%. Left atrial cavity is moderately dilated at 4.5 cm. Right atrial cavity is mildly dilated. Mild (Grade I) mitral regurgitation. Moderate tricuspid regurgitation. Mild pulmonary hypertension. Estimated pulmonary artery systolic pressure 39  mmHg.  Lexiscan myoview stress test 06/07/2017:  1. Pharmacologic stress testing was performed with intravenous administration of .4 mg of Lexiscan over a 10-15 seconds infusion. Patient was hypertensive throughout the study.  Stress symptoms included dyspnea, flushing and headache. 2.  Exercise capacity  not assessed. Stress EKG is non diagnostic for ischemia as it is a pharmacologic stress.  3. The overall quality of the study is excellent. There is no evidence of abnormal lung activity. Stress and rest SPECT images demonstrate homogeneous tracer distribution throughout the myocardium. Gated SPECT imaging reveals normal myocardial thickening and wall motion. The left ventricular ejection fraction was normal (52%).   4. This is a low risk study.  Pulmonary function test 05/20/2017: Diffusion defect consistent with pulmonary vascular process.  Normal FVC, FEV1, FEV1/FVC ratio and FEF 25-75%. FRC reduced  Recent labs: 09/27/2017: Glucose 122. BUN/Cr 17/0.81. eGFR 82. Na/K 139/4.7  Labs 05/29/2017:  H/H 13/40. MCV 89. Platelets 260 Glucose 107. BUN/creatinine 15/0.7. EGFR 89/108 Cholesterol 222, triglycerides 59, HDL 70, LDL 140.  Labs 05/13/2017: H/H 13/40. MCV 89. Platelets 216 Glucose 99. BUN/Cr 20/0.7. eGFR 85/103. Na/K 139/4.1 BNP 196  Review of Systems  Constitution: Negative for decreased appetite, malaise/fatigue, weight gain and weight loss.  HENT: Negative for congestion.   Eyes: Negative for visual disturbance.  Cardiovascular: Positive for dyspnea on exertion (Mild, stable). Negative for chest pain, leg swelling, palpitations and syncope.  Respiratory: Positive for shortness of breath. Negative for cough.   Endocrine: Negative for cold intolerance.  Hematologic/Lymphatic: Does not bruise/bleed easily.  Skin: Negative for itching and rash.  Musculoskeletal: Negative for myalgias.  Gastrointestinal: Negative for abdominal pain, nausea and vomiting.  Genitourinary: Negative for dysuria.  Neurological: Negative for dizziness and weakness.  Psychiatric/Behavioral: The patient is not nervous/anxious.   All other systems reviewed and are negative.       Vitals:   10/28/18 1456  BP: (!) 158/84  Pulse: 65   (Measured by the patient using a home BP monitor)   Observation/findings during video visit   Objective:    Physical Exam  Constitutional: She is oriented to person, place, and time. She appears well-developed and well-nourished. No distress.  Pulmonary/Chest: Effort normal.  Neurological: She is alert and oriented to person, place, and time.  Psychiatric: She has a normal mood and affect.  Nursing note and vitals reviewed.         Assessment & Recommendations:   78 year old African-American female with hypertension, morbid obesity, exertional dyspnea likely due to heart failure with preserved ejection fraction  HFpEF: Mild. Needs aggressive blood pressure control.  Hypertension: Suboptimal control.  Added amlodipine 5 mg daily. I will obtain lab results form PCP.  Hyperlipidemia: Recommend adding lipid panel to upcoming lab check with PCP.  F/u in 12/2018.   [name], MD [facility] [place] Pager: [phone] Office: [phone] If no answer Cell [phone]

## 2018-11-12 ENCOUNTER — Ambulatory Visit: Payer: Self-pay | Admitting: Cardiology

## 2018-12-03 DIAGNOSIS — R69 Illness, unspecified: Secondary | ICD-10-CM | POA: Diagnosis not present

## 2019-01-14 ENCOUNTER — Telehealth: Payer: Medicare HMO | Admitting: Cardiology

## 2019-01-20 ENCOUNTER — Other Ambulatory Visit: Payer: Self-pay | Admitting: Cardiology

## 2019-01-20 DIAGNOSIS — I10 Essential (primary) hypertension: Secondary | ICD-10-CM

## 2019-01-21 ENCOUNTER — Encounter: Payer: Self-pay | Admitting: Cardiology

## 2019-01-21 ENCOUNTER — Other Ambulatory Visit: Payer: Self-pay

## 2019-01-21 ENCOUNTER — Telehealth (INDEPENDENT_AMBULATORY_CARE_PROVIDER_SITE_OTHER): Payer: Medicare HMO | Admitting: Cardiology

## 2019-01-21 VITALS — BP 160/103 | HR 96 | Ht 64.0 in | Wt 249.0 lb

## 2019-01-21 DIAGNOSIS — E782 Mixed hyperlipidemia: Secondary | ICD-10-CM | POA: Diagnosis not present

## 2019-01-21 DIAGNOSIS — R0609 Other forms of dyspnea: Secondary | ICD-10-CM | POA: Diagnosis not present

## 2019-01-21 DIAGNOSIS — I10 Essential (primary) hypertension: Secondary | ICD-10-CM

## 2019-01-21 DIAGNOSIS — I5032 Chronic diastolic (congestive) heart failure: Secondary | ICD-10-CM | POA: Diagnosis not present

## 2019-01-21 DIAGNOSIS — R0600 Dyspnea, unspecified: Secondary | ICD-10-CM

## 2019-01-21 DIAGNOSIS — I5030 Unspecified diastolic (congestive) heart failure: Secondary | ICD-10-CM | POA: Insufficient documentation

## 2019-01-21 MED ORDER — SPIRONOLACTONE 25 MG PO TABS: 25.0000 mg | ORAL_TABLET | Freq: Every day | ORAL | 3 refills | Status: DC

## 2019-01-21 NOTE — Progress Notes (Signed)
Subjective:   Christine Munoz, female    DOB: Jul 28, 1940, 78 y.o.   MRN: [id number]   I connected with the patient on 01/21/19 by a video enabled telemedicine application and verified that I am speaking with the correct person using two identifiers.     I discussed the limitations of evaluation and management by telemedicine and the availability of in person appointments. The patient expressed understanding and agreed to proceed.   This visit type was conducted due to national recommendations for restrictions regarding the COVID-19 Pandemic (e.g. social distancing).  This format is felt to be most appropriate for this patient at this time.  All issues noted in this document were discussed and addressed.  No physical exam was performed (except for noted visual exam findings with Tele health visits).  The patient has consented to conduct a Tele health visit and understands insurance will be billed.     Chief complaint:  Hypertension   HPI  78 year old African-American female with hypertension, morbid obesity, exertional dyspnea likely due to heart failure with preserved ejection fraction, stress test negative for ischemia.  At last visit, her blood pressure was not well controlled.  I added amlodipine 5 mg daily.  Blood pressure continues to elevated. She has dyspnea with walking to the mailbox. She denies any chest pain. She has burning sensation/lump in stomach. This only occurs after eating and does not occur with walking. That said, her walking is limited due to knee pain due to arthritis.   Past Medical History:  Diagnosis Date  . Anemia    h/o anemia  . Arthritis   . GERD (gastroesophageal reflux disease)    no meds  . Hyperlipidemia    borderline  . Leg abrasion    had to go to the wound center-? diag  . Shortness of breath    due to her weight- per pt     Past Surgical History:  Procedure Laterality Date  . CATARACT EXTRACTION     11/16/14 and 12/13/14 (Dr. Herbert Deaner)   . DILATATION & CURRETTAGE/HYSTEROSCOPY WITH RESECTOCOPE N/A 12/07/2013   Procedure: [place];  Surgeon: Lyman Speller, MD;  Location: [facility];  Service: Gynecology;  Laterality: N/A;  . FOOT SURGERY  1/16   hamer toe repair  . ROTATOR CUFF REPAIR Right 05/24/2015  . TONSILLECTOMY    . TUBAL LIGATION       Social History   Socioeconomic History  . Marital status: Single    Spouse name: Not on file  . Number of children: Not on file  . Years of education: Not on file  . Highest education level: Not on file  Occupational History  . Not on file  Social Needs  . Financial resource strain: Not on file  . Food insecurity    Worry: Not on file    Inability: Not on file  . Transportation needs    Medical: Not on file    Non-medical: Not on file  Tobacco Use  . Smoking status: Former Research scientist (life sciences)  . Smokeless tobacco: Never Used  . Tobacco comment: years ago-not heavy/quit 2003/2004  Substance and Sexual Activity  . Alcohol use: Yes    Alcohol/week: 0.0 - 1.0 standard drinks  . Drug use: No  . Sexual activity: Not Currently    Partners: Male  Lifestyle  . Physical activity    Days per week: Not on file    Minutes per session: Not on file  . Stress: Not  on file  Relationships  . Social Herbalist on phone: Not on file    Gets together: Not on file    Attends religious service: Not on file    Active member of club or organization: Not on file    Attends meetings of clubs or organizations: Not on file    Relationship status: Not on file  . Intimate partner violence    Fear of current or ex partner: Not on file    Emotionally abused: Not on file    Physically abused: Not on file    Forced sexual activity: Not on file  Other Topics Concern  . Not on file  Social History Narrative  . Not on file     Family History  Problem Relation Age of Onset  . Liver cancer Sister   . Brain cancer Father        tumor  .  Arthritis Mother   . Colon cancer Maternal Uncle   . Leukemia Maternal Uncle      Current Outpatient Medications on File Prior to Visit  Medication Sig Dispense Refill  . Acetaminophen (TYLENOL EXTRA STRENGTH PO) Take by mouth.    Marland Kitchen amLODipine (NORVASC) 5 MG tablet TAKE 1 TABLET BY MOUTH EVERY DAY 90 tablet 1  . Cholecalciferol (VITAMIN D PO) Take 2,000 Int'l Units by mouth daily.     . furosemide (LASIX) 20 MG tablet daily as needed.    Marland Kitchen losartan (COZAAR) 100 MG tablet 100 mg.   5  . meloxicam (MOBIC) 15 MG tablet Take 15 mg by mouth daily as needed for pain.    . Multiple Vitamins-Minerals (MULTIVITAMIN PO) Take 1 tablet by mouth daily.     . Omega-3 Fatty Acids (FISH OIL) 1000 MG CAPS Take by mouth.    . pantoprazole (PROTONIX) 40 MG tablet Take 40 mg by mouth as needed.    . Turmeric 400 MG CAPS Take by mouth.    . Vitamin D, Ergocalciferol, (DRISDOL) 1.25 MG (50000 UT) CAPS capsule Take 50,000 Units by mouth every 7 (seven) days.     No current facility-administered medications on file prior to visit.     Cardiovascular studies:  Echocardiogram 06/11/2017: Left ventricle cavity is normal in size. Mild concentric hypertrophy of the left ventricle. Normal global wall motion. Doppler evidence of grade II (pseudonormal) diastolic dysfunction. Diastolic dysfunction findings suggests elevated LA/LV end diastolic pressure. Calculated EF 61%. Left atrial cavity is moderately dilated at 4.5 cm. Right atrial cavity is mildly dilated. Mild (Grade I) mitral regurgitation. Moderate tricuspid regurgitation. Mild pulmonary hypertension. Estimated pulmonary artery systolic pressure 39  mmHg.  Lexiscan myoview stress test 06/07/2017:  1. Pharmacologic stress testing was performed with intravenous administration of .4 mg of Lexiscan over a 10-15 seconds infusion. Patient was hypertensive throughout the study.  Stress symptoms included dyspnea, flushing and headache. 2.  Exercise capacity not  assessed. Stress EKG is non diagnostic for ischemia as it is a pharmacologic stress.  3. The overall quality of the study is excellent. There is no evidence of abnormal lung activity. Stress and rest SPECT images demonstrate homogeneous tracer distribution throughout the myocardium. Gated SPECT imaging reveals normal myocardial thickening and wall motion. The left ventricular ejection fraction was normal (52%).   4. This is a low risk study.  Pulmonary function test 05/20/2017: Diffusion defect consistent with pulmonary vascular process.  Normal FVC, FEV1, FEV1/FVC ratio and FEF 25-75%. FRC reduced  Recent labs: 08/15/2018: Glucose 109. BUN/Cr  0.8. eGFR 87. Na/K 141/4.3 AlkPhos 162 (25-150) H/H 13/43. MCV 87. Platelets 188.  Chol 226, TG 75, HDL 65, LDL 146.  HbA1C 6.4% TSH 3.0    09/27/2017: Glucose 122. BUN/Cr 17/0.81. eGFR 82. Na/K 139/4.7  Labs 05/29/2017: H/H 13/40. MCV 89. Platelets 260 Glucose 107. BUN/creatinine 15/0.7. EGFR 89/108 Cholesterol 222, triglycerides 59, HDL 70, LDL 140.  Labs 05/13/2017: H/H 13/40. MCV 89. Platelets 216 Glucose 99. BUN/Cr 20/0.7. eGFR 85/103. Na/K 139/4.1 BNP 196  Review of Systems  Constitution: Negative for decreased appetite, malaise/fatigue, weight gain and weight loss.  HENT: Negative for congestion.   Eyes: Negative for visual disturbance.  Cardiovascular: Positive for dyspnea on exertion (Mild, stable). Negative for chest pain, leg swelling, palpitations and syncope.  Respiratory: Positive for shortness of breath. Negative for cough.   Endocrine: Negative for cold intolerance.  Hematologic/Lymphatic: Does not bruise/bleed easily.  Skin: Negative for itching and rash.  Musculoskeletal: Negative for myalgias.  Gastrointestinal: Negative for abdominal pain, nausea and vomiting.  Genitourinary: Negative for dysuria.  Neurological: Negative for dizziness and weakness.  Psychiatric/Behavioral: The patient is not nervous/anxious.   All  other systems reviewed and are negative.       Vitals:   01/21/19 1539 01/21/19 1547  BP: (!) 164/108 (!) 160/103  Pulse: 99 96   (Measured by the patient using a home BP monitor)  Observation/findings during video visit   Objective:    Physical Exam  Constitutional: She is oriented to person, place, and time. She appears well-developed and well-nourished. No distress.  Pulmonary/Chest: Effort normal.  Neurological: She is alert and oriented to person, place, and time.  Psychiatric: She has a normal mood and affect.  Nursing note and vitals reviewed.         Assessment & Recommendations:   78 year old African-American female with hypertension, morbid obesity, exertional dyspnea likely due to heart failure with preserved ejection fraction  HFpEF: NYHA class II-III symptoms. Needs aggressive blood pressure control. Added spironolactone 25 mg daily. Continue losartan 100 mg daily, amlodipine 5 mg daily. Will request PCP Dr. Julianne Rice office to add BMP check in 1-2 weeks. Will check echo at next office visit.   Hypertension: Suboptimal control. See above,   Hyperlipidemia: Suboptimal. Received lipid panel results after today's visit. Will discuss adding statin at next visit.   In office f/u in 4 weeks.   [name], MD [facility] [place] Pager: [phone] Office: [phone] If no answer Cell [phone]

## 2019-01-22 ENCOUNTER — Other Ambulatory Visit: Payer: Self-pay | Admitting: Cardiology

## 2019-01-22 DIAGNOSIS — R0600 Dyspnea, unspecified: Secondary | ICD-10-CM

## 2019-01-22 DIAGNOSIS — R0609 Other forms of dyspnea: Secondary | ICD-10-CM

## 2019-01-27 DIAGNOSIS — R7309 Other abnormal glucose: Secondary | ICD-10-CM | POA: Diagnosis not present

## 2019-01-27 DIAGNOSIS — E559 Vitamin D deficiency, unspecified: Secondary | ICD-10-CM | POA: Diagnosis not present

## 2019-01-27 DIAGNOSIS — E7800 Pure hypercholesterolemia, unspecified: Secondary | ICD-10-CM | POA: Diagnosis not present

## 2019-01-27 DIAGNOSIS — I10 Essential (primary) hypertension: Secondary | ICD-10-CM | POA: Diagnosis not present

## 2019-02-18 DIAGNOSIS — R2681 Unsteadiness on feet: Secondary | ICD-10-CM | POA: Diagnosis not present

## 2019-02-18 DIAGNOSIS — R7303 Prediabetes: Secondary | ICD-10-CM | POA: Diagnosis not present

## 2019-02-18 DIAGNOSIS — E559 Vitamin D deficiency, unspecified: Secondary | ICD-10-CM | POA: Diagnosis not present

## 2019-02-18 DIAGNOSIS — E7800 Pure hypercholesterolemia, unspecified: Secondary | ICD-10-CM | POA: Diagnosis not present

## 2019-02-18 DIAGNOSIS — I10 Essential (primary) hypertension: Secondary | ICD-10-CM | POA: Diagnosis not present

## 2019-02-19 ENCOUNTER — Ambulatory Visit: Payer: Medicare HMO | Admitting: Cardiology

## 2019-02-19 ENCOUNTER — Other Ambulatory Visit: Payer: Medicare HMO

## 2019-03-05 ENCOUNTER — Other Ambulatory Visit: Payer: Self-pay

## 2019-03-05 ENCOUNTER — Ambulatory Visit (INDEPENDENT_AMBULATORY_CARE_PROVIDER_SITE_OTHER): Payer: Medicare HMO

## 2019-03-05 ENCOUNTER — Encounter: Payer: Self-pay | Admitting: Cardiology

## 2019-03-05 ENCOUNTER — Ambulatory Visit (INDEPENDENT_AMBULATORY_CARE_PROVIDER_SITE_OTHER): Payer: Medicare HMO | Admitting: Cardiology

## 2019-03-05 VITALS — BP 142/82 | HR 75 | Temp 98.0°F | Ht 67.0 in | Wt 260.0 lb

## 2019-03-05 DIAGNOSIS — I10 Essential (primary) hypertension: Secondary | ICD-10-CM

## 2019-03-05 DIAGNOSIS — I5032 Chronic diastolic (congestive) heart failure: Secondary | ICD-10-CM

## 2019-03-05 DIAGNOSIS — E782 Mixed hyperlipidemia: Secondary | ICD-10-CM

## 2019-03-05 DIAGNOSIS — R0600 Dyspnea, unspecified: Secondary | ICD-10-CM

## 2019-03-05 DIAGNOSIS — R0609 Other forms of dyspnea: Secondary | ICD-10-CM | POA: Diagnosis not present

## 2019-03-05 NOTE — Progress Notes (Signed)
Subjective:   Christine Munoz, female    DOB: 11/27/1940, 78 y.o.   MRN: [id number]   Chief complaint:  Hypertension   78 year old African-American female with hypertension, morbid obesity, exertional dyspnea likely due to heart failure with preserved ejection fraction, stress test negative for ischemia.  Blood pressure and leg edema has improved after addition of spironolactone. Echocardiogram from today discussed with the patient. Her biggest complaint at this time remains knee pain.  She has occasional retrosternal burning, only at rest, not with exertion.  She is walking 2-3 blocks with mild, stable exertional dyspnea.  Past Medical History:  Diagnosis Date  . Anemia    h/o anemia  . Arthritis   . GERD (gastroesophageal reflux disease)    no meds  . Hyperlipidemia    borderline  . Hypertension   . Leg abrasion    had to go to the wound center-? diag  . Shortness of breath    due to her weight- per pt     Past Surgical History:  Procedure Laterality Date  . CATARACT EXTRACTION     11/16/14 and 12/13/14 (Dr. Herbert Deaner)  . DILATATION & CURRETTAGE/HYSTEROSCOPY WITH RESECTOCOPE N/A 12/07/2013   Procedure: [place];  Surgeon: Lyman Speller, MD;  Location: [facility];  Service: Gynecology;  Laterality: N/A;  . FOOT SURGERY  1/16   hamer toe repair  . ROTATOR CUFF REPAIR Right 05/24/2015  . TONSILLECTOMY    . TUBAL LIGATION       Social History   Socioeconomic History  . Marital status: Single    Spouse name: Not on file  . Number of children: Not on file  . Years of education: Not on file  . Highest education level: Not on file  Occupational History  . Not on file  Social Needs  . Financial resource strain: Not on file  . Food insecurity    Worry: Not on file    Inability: Not on file  . Transportation needs    Medical: Not on file    Non-medical: Not on file  Tobacco Use  . Smoking status: Former Research scientist (life sciences)  .  Smokeless tobacco: Never Used  . Tobacco comment: years ago-not heavy/quit 2003/2004  Substance and Sexual Activity  . Alcohol use: Yes    Alcohol/week: 0.0 - 1.0 standard drinks  . Drug use: No  . Sexual activity: Not Currently    Partners: Male  Lifestyle  . Physical activity    Days per week: Not on file    Minutes per session: Not on file  . Stress: Not on file  Relationships  . Social Herbalist on phone: Not on file    Gets together: Not on file    Attends religious service: Not on file    Active member of club or organization: Not on file    Attends meetings of clubs or organizations: Not on file    Relationship status: Not on file  . Intimate partner violence    Fear of current or ex partner: Not on file    Emotionally abused: Not on file    Physically abused: Not on file    Forced sexual activity: Not on file  Other Topics Concern  . Not on file  Social History Narrative  . Not on file     Family History  Problem Relation Age of Onset  . Liver cancer Sister   . Brain cancer Father  tumor  . Arthritis Mother   . Colon cancer Maternal Uncle   . Leukemia Maternal Uncle      Current Outpatient Medications on File Prior to Visit  Medication Sig Dispense Refill  . Acetaminophen (TYLENOL EXTRA STRENGTH PO) Take by mouth.    Marland Kitchen amLODipine (NORVASC) 5 MG tablet TAKE 1 TABLET BY MOUTH EVERY DAY 90 tablet 1  . Cholecalciferol (VITAMIN D PO) Take 2,000 Int'l Units by mouth daily.     Marland Kitchen losartan (COZAAR) 100 MG tablet 100 mg.   5  . Multiple Vitamins-Minerals (MULTIVITAMIN PO) Take 1 tablet by mouth daily.     . Omega-3 Fatty Acids (FISH OIL) 1000 MG CAPS Take by mouth.    . pantoprazole (PROTONIX) 40 MG tablet Take 40 mg by mouth as needed.    Marland Kitchen spironolactone (ALDACTONE) 25 MG tablet Take 1 tablet (25 mg total) by mouth daily. 30 tablet 3  . Turmeric 400 MG CAPS Take by mouth.    . Vitamin D, Ergocalciferol, (DRISDOL) 1.25 MG (50000 UT) CAPS capsule  Take 50,000 Units by mouth every 7 (seven) days.    . meloxicam (MOBIC) 15 MG tablet Take 15 mg by mouth daily as needed for pain.     No current facility-administered medications on file prior to visit.     Cardiovascular studies:  Echocardiogram 03/05/2019: Left ventricle cavity is normal in size. Mild concentric hypertrophy of the left ventricle. Normal LV systolic function with EF 55%. Normal global wall motion. Doppler evidence of grade I (impaired) diastolic dysfunction, normal LAP.  Mild tricuspid regurgitation. Estimated pulmonary artery systolic pressure is 30 mmHg.  Compared to previous study on 06/11/2017, LV filling pressures are pulmonary artery pressure are lower.   EKG 03/05/2019:  Sinus rhythm 65 bpm.  Lexiscan myoview stress test 06/07/2017:  1. Pharmacologic stress testing was performed with intravenous administration of .4 mg of Lexiscan over a 10-15 seconds infusion. Patient was hypertensive throughout the study.  Stress symptoms included dyspnea, flushing and headache. 2.  Exercise capacity not assessed. Stress EKG is non diagnostic for ischemia as it is a pharmacologic stress.  3. The overall quality of the study is excellent. There is no evidence of abnormal lung activity. Stress and rest SPECT images demonstrate homogeneous tracer distribution throughout the myocardium. Gated SPECT imaging reveals normal myocardial thickening and wall motion. The left ventricular ejection fraction was normal (52%).   4. This is a low risk study.  Pulmonary function test 05/20/2017: Diffusion defect consistent with pulmonary vascular process.  Normal FVC, FEV1, FEV1/FVC ratio and FEF 25-75%. FRC reduced  Recent labs: 08/15/2018: Glucose 109. BUN/Cr 0.8. eGFR 87. Na/K 141/4.3 AlkPhos 162 (25-150) H/H 13/43. MCV 87. Platelets 188.  Chol 226, TG 75, HDL 65, LDL 146.  HbA1C 6.4% TSH 3.0    09/27/2017: Glucose 122. BUN/Cr 17/0.81. eGFR 82. Na/K 139/4.7  Labs 05/29/2017: H/H  13/40. MCV 89. Platelets 260 Glucose 107. BUN/creatinine 15/0.7. EGFR 89/108 Cholesterol 222, triglycerides 59, HDL 70, LDL 140.  Labs 05/13/2017: H/H 13/40. MCV 89. Platelets 216 Glucose 99. BUN/Cr 20/0.7. eGFR 85/103. Na/K 139/4.1 BNP 196  Review of Systems  Constitution: Negative for decreased appetite, malaise/fatigue, weight gain and weight loss.  HENT: Negative for congestion.   Eyes: Negative for visual disturbance.  Cardiovascular: Positive for dyspnea on exertion (Mild, stable). Negative for chest pain, leg swelling, palpitations and syncope.  Respiratory: Positive for shortness of breath. Negative for cough.   Endocrine: Negative for cold intolerance.  Hematologic/Lymphatic: Does not  bruise/bleed easily.  Skin: Negative for itching and rash.  Musculoskeletal: Negative for myalgias.  Gastrointestinal: Negative for abdominal pain, nausea and vomiting.  Genitourinary: Negative for dysuria.  Neurological: Negative for dizziness and weakness.  Psychiatric/Behavioral: The patient is not nervous/anxious.   All other systems reviewed and are negative.        Vitals:   03/05/19 1421  BP: (!) 142/82  Pulse: 75  Temp: 98 F (36.7 C)  SpO2: 100%     Objective:    Physical Exam  Constitutional: She is oriented to person, place, and time. She appears well-developed and well-nourished. No distress.  Pulmonary/Chest: Effort normal.  Neurological: She is alert and oriented to person, place, and time.  Psychiatric: She has a normal mood and affect.  Nursing note and vitals reviewed.         Assessment & Recommendations:   78 year old African-American female with hypertension, morbid obesity, exertional dyspnea likely due to heart failure with preserved ejection fraction  HFpEF: NYHA class II symptoms. Continue aggressive blood pressure management. Conitnue spironolactone 25 mg daily, losartan 100 mg daily, amlodipine 5 mg daily.  Will obtain lab results from PCP.    Hypertension: Better controlled.  Hyperlipidemia: LDL 146, HDL 65 in 07/2018. ASCV risk 20%. Recommend low dose crestor 10 mg daily, unless repeat lipid panel is remarkably better.   F/u in 3 months.   [name], MD [facility] Pager: [phone] Office: [phone] If no answer Cell [phone]

## 2019-03-06 ENCOUNTER — Encounter: Payer: Self-pay | Admitting: Cardiology

## 2019-04-06 ENCOUNTER — Other Ambulatory Visit: Payer: Self-pay | Admitting: Internal Medicine

## 2019-04-06 DIAGNOSIS — Z1231 Encounter for screening mammogram for malignant neoplasm of breast: Secondary | ICD-10-CM

## 2019-04-15 ENCOUNTER — Other Ambulatory Visit: Payer: Self-pay | Admitting: Cardiology

## 2019-04-15 DIAGNOSIS — R0609 Other forms of dyspnea: Secondary | ICD-10-CM

## 2019-04-15 DIAGNOSIS — R0600 Dyspnea, unspecified: Secondary | ICD-10-CM

## 2019-04-23 ENCOUNTER — Ambulatory Visit
Admission: RE | Admit: 2019-04-23 | Discharge: 2019-04-23 | Disposition: A | Discharge status: Home / Self Care | Payer: Medicare HMO | Source: Ambulatory Visit | Attending: Internal Medicine | Admitting: Internal Medicine

## 2019-04-23 ENCOUNTER — Other Ambulatory Visit: Payer: Self-pay

## 2019-04-23 DIAGNOSIS — Z1231 Encounter for screening mammogram for malignant neoplasm of breast: Secondary | ICD-10-CM | POA: Diagnosis not present

## 2019-04-27 ENCOUNTER — Other Ambulatory Visit: Payer: Self-pay | Admitting: Internal Medicine

## 2019-04-27 ENCOUNTER — Other Ambulatory Visit: Payer: Self-pay

## 2019-04-27 DIAGNOSIS — R928 Other abnormal and inconclusive findings on diagnostic imaging of breast: Secondary | ICD-10-CM

## 2019-04-28 ENCOUNTER — Telehealth: Payer: Self-pay | Admitting: *Deleted

## 2019-04-28 ENCOUNTER — Encounter: Payer: Self-pay | Admitting: Obstetrics & Gynecology

## 2019-04-28 ENCOUNTER — Ambulatory Visit (INDEPENDENT_AMBULATORY_CARE_PROVIDER_SITE_OTHER): Payer: Medicare HMO | Admitting: Obstetrics & Gynecology

## 2019-04-28 VITALS — BP 130/74 | HR 76 | Temp 97.7°F | Ht 65.5 in | Wt 262.0 lb

## 2019-04-28 DIAGNOSIS — Z1211 Encounter for screening for malignant neoplasm of colon: Secondary | ICD-10-CM | POA: Diagnosis not present

## 2019-04-28 DIAGNOSIS — Z01419 Encounter for gynecological examination (general) (routine) without abnormal findings: Secondary | ICD-10-CM | POA: Diagnosis not present

## 2019-04-28 NOTE — Telephone Encounter (Signed)
Reviewed 04/28/19 OV notes.   Routing to Dr. Sabra Heck to advise on OTC antifungal cream.

## 2019-04-28 NOTE — Progress Notes (Signed)
79 y.o. G1P1 Single Black or [place] American female here for annual exam.  Denies vaginal bleeding.  Saw cardiology this year.  Mother passed in February, 2020.  Was grateful to be able to have a funeral before Covid.  Having knee issues on the right.  Has seen ortho in the past.    PCP:  Dr. Maudie Mercury.  Has appt in about a month.    No LMP recorded. Patient is postmenopausal.          Sexually active: No.  The current method of family planning is post menopausal status.    Exercising: No.  The patient does not participate in regular exercise at present. Smoker:  no  Health Maintenance: Pap:  02/04/17 Neg             11/02/13 Neg  History of abnormal Pap:  no MMG:  04/23/19 BIRADS 0:Incomplete.  Discussed with pt.   Colonoscopy:  01/25/09 patient is due.  Desires referral.   BMD:   PCP TDaP:  Td 05/15/11 Pneumonia vaccine(s):  Completed Shingrix:   Completed Hep C testing: n/a Screening Labs: PCP   reports that she has quit smoking. She has never used smokeless tobacco. She reports current alcohol use. She reports that she does not use drugs.  Past Medical History:  Diagnosis Date  . Anemia    h/o anemia  . Arthritis   . GERD (gastroesophageal reflux disease)    no meds  . Hyperlipidemia    borderline  . Hypertension   . Leg abrasion    had to go to the wound center-? diag  . Shortness of breath    due to her weight- per pt    Past Surgical History:  Procedure Laterality Date  . CATARACT EXTRACTION     11/16/14 and 12/13/14 (Dr. Herbert Deaner)  . DILATATION & CURRETTAGE/HYSTEROSCOPY WITH RESECTOCOPE N/A 12/07/2013   Procedure: [place];  Surgeon: Lyman Speller, MD;  Location: [facility];  Service: Gynecology;  Laterality: N/A;  . FOOT SURGERY  1/16   hamer toe repair  . ROTATOR CUFF REPAIR Right 05/24/2015  . TONSILLECTOMY    . TUBAL LIGATION      Current Outpatient Medications  Medication Sig Dispense Refill  . Acetaminophen  (TYLENOL EXTRA STRENGTH PO) Take by mouth.    Marland Kitchen amLODipine (NORVASC) 5 MG tablet TAKE 1 TABLET BY MOUTH EVERY DAY 90 tablet 1  . Cholecalciferol (VITAMIN D PO) Take 2,000 Int'l Units by mouth daily.     Marland Kitchen losartan (COZAAR) 100 MG tablet 100 mg.   5  . Multiple Vitamins-Minerals (MULTIVITAMIN PO) Take 1 tablet by mouth daily.     . Omega-3 Fatty Acids (FISH OIL) 1000 MG CAPS Take by mouth.    . pantoprazole (PROTONIX) 40 MG tablet Take 40 mg by mouth as needed.    . Turmeric 400 MG CAPS Take by mouth.    . Vitamin D, Ergocalciferol, (DRISDOL) 1.25 MG (50000 UT) CAPS capsule Take 50,000 Units by mouth every 7 (seven) days.    Marland Kitchen spironolactone (ALDACTONE) 25 MG tablet TAKE 1 TABLET BY MOUTH EVERY DAY (Patient not taking: Reported on 04/28/2019) 90 tablet 1   No current facility-administered medications for this visit.    Family History  Problem Relation Age of Onset  . Liver cancer Sister   . Brain cancer Father        tumor  . Arthritis Mother   . Colon cancer Maternal Uncle   . Leukemia  Maternal Uncle     Review of Systems  All other systems reviewed and are negative.   Exam:   BP 130/74 (BP Location: Left Arm, Patient Position: Sitting, Cuff Size: Large)   Pulse 76   Temp 97.7 F (36.5 C) (Temporal)   Ht 5' 5.5" (1.664 m)   Wt 262 lb (118.8 kg)   BMI 42.94 kg/m   Height: 5' 5.5" (166.4 cm)  Ht Readings from Last 3 Encounters:  04/28/19 5' 5.5" (1.664 m)  03/05/19 5\' 7"  (1.702 m)  01/21/19 5\' 4"  (1.626 m)   General appearance: alert, cooperative and appears stated age Head: Normocephalic, without obvious abnormality, atraumatic Neck: no adenopathy, supple, symmetrical, trachea midline and thyroid normal to inspection and palpation Lungs: clear to auscultation bilaterally Breasts: normal appearance, no masses or tenderness Heart: regular rate and rhythm Abdomen: soft, non-tender; bowel sounds normal; no masses,  no organomegaly Extremities: extremities normal, atraumatic,  no cyanosis or edema Skin: Skin color, texture, turgor normal. No rashes or lesions Lymph nodes: Cervical, supraclavicular, and axillary nodes normal. No abnormal inguinal nodes palpated Neurologic: Grossly normal   Pelvic: External genitalia:  no lesions              Urethra:  normal appearing urethra with no masses, tenderness or lesions              Bartholins and Skenes: normal                 Vagina: normal appearing vagina with normal color and discharge, no lesions              Cervix: no lesions              Pap taken: No. Bimanual Exam:  Uterus:  normal size, contour, position, consistency, mobility, non-tender              Adnexa: normal adnexa and no mass, fullness, tenderness               Rectovaginal: Confirms               Anus:  normal sphincter tone, no lesions  Chaperone, Terence Lux, CMA, was present for exam.  A:  Well Woman with normal exam PMP, no HRT H/o polyp resection with D&C 2015 Arthritis with knee/shoulder pain Hypertension  P:   Mammogram follow up is needed.  Will schedule for pt.   pap smear not indicated Colonoscopy will be scheduled for pt Vaccines are UTD Blood work done with Dr. Maudie Mercury recently Return annually or prn

## 2019-04-28 NOTE — Patient Instructions (Signed)
[email or web address]

## 2019-04-28 NOTE — Telephone Encounter (Signed)
Lamisil or Lotrimin.  I meant to write this on her after visit summary.  Thanks.

## 2019-04-28 NOTE — Telephone Encounter (Signed)
Patient was given the name of an antifungal cream while she was here but she does not remember the name.

## 2019-04-28 NOTE — Telephone Encounter (Signed)
Spoke with patient, advised per Dr. Miller. Patient verbalizes understanding and is agreeable. Encounter closed.  

## 2019-05-05 ENCOUNTER — Other Ambulatory Visit: Payer: Self-pay | Admitting: Internal Medicine

## 2019-05-05 ENCOUNTER — Other Ambulatory Visit: Payer: Self-pay

## 2019-05-05 ENCOUNTER — Ambulatory Visit
Admission: RE | Admit: 2019-05-05 | Discharge: 2019-05-05 | Disposition: A | Discharge status: Home / Self Care | Payer: Medicare HMO | Source: Ambulatory Visit | Attending: Internal Medicine | Admitting: Internal Medicine

## 2019-05-05 DIAGNOSIS — N6489 Other specified disorders of breast: Secondary | ICD-10-CM | POA: Diagnosis not present

## 2019-05-05 DIAGNOSIS — R928 Other abnormal and inconclusive findings on diagnostic imaging of breast: Secondary | ICD-10-CM

## 2019-05-07 ENCOUNTER — Ambulatory Visit
Admission: RE | Admit: 2019-05-07 | Discharge: 2019-05-07 | Disposition: A | Discharge status: Home / Self Care | Payer: Medicare HMO | Source: Ambulatory Visit | Attending: Internal Medicine | Admitting: Internal Medicine

## 2019-05-07 ENCOUNTER — Other Ambulatory Visit: Payer: Self-pay

## 2019-05-07 DIAGNOSIS — N6489 Other specified disorders of breast: Secondary | ICD-10-CM

## 2019-05-07 DIAGNOSIS — N6012 Diffuse cystic mastopathy of left breast: Secondary | ICD-10-CM | POA: Diagnosis not present

## 2019-05-07 DIAGNOSIS — R590 Localized enlarged lymph nodes: Secondary | ICD-10-CM | POA: Diagnosis not present

## 2019-05-12 ENCOUNTER — Other Ambulatory Visit: Payer: Self-pay | Admitting: Internal Medicine

## 2019-05-12 DIAGNOSIS — N6489 Other specified disorders of breast: Secondary | ICD-10-CM

## 2019-05-13 ENCOUNTER — Other Ambulatory Visit: Payer: Medicare HMO

## 2019-06-05 ENCOUNTER — Encounter: Payer: Self-pay | Admitting: Cardiology

## 2019-06-05 ENCOUNTER — Ambulatory Visit: Payer: Medicare HMO | Admitting: Cardiology

## 2019-06-05 ENCOUNTER — Other Ambulatory Visit: Payer: Self-pay

## 2019-06-05 VITALS — BP 107/67 | HR 80 | Temp 97.3°F | Ht 65.0 in | Wt 250.0 lb

## 2019-06-05 DIAGNOSIS — R0600 Dyspnea, unspecified: Secondary | ICD-10-CM

## 2019-06-05 DIAGNOSIS — R0609 Other forms of dyspnea: Secondary | ICD-10-CM

## 2019-06-05 DIAGNOSIS — I10 Essential (primary) hypertension: Secondary | ICD-10-CM

## 2019-06-05 DIAGNOSIS — I5032 Chronic diastolic (congestive) heart failure: Secondary | ICD-10-CM

## 2019-06-05 DIAGNOSIS — E782 Mixed hyperlipidemia: Secondary | ICD-10-CM

## 2019-06-05 NOTE — Progress Notes (Signed)
Subjective:   Christine Munoz, female    DOB: 29-Mar-1941, 79 y.o.   MRN: [id number]   Chief complaint:  Hypertension   79 year old African-American female with hypertension, morbid obesity, Heart failure with preserved ejection fraction.  Patient is doing well.  Exertional dyspnea has improved.  Blood pressure is very well controlled.  Leg edema has resolved.  She is compliant with medical therapy.   Current Outpatient Medications on File Prior to Visit  Medication Sig Dispense Refill  . Acetaminophen (TYLENOL EXTRA STRENGTH PO) Take by mouth.    Marland Kitchen amLODipine (NORVASC) 5 MG tablet TAKE 1 TABLET BY MOUTH EVERY DAY 90 tablet 1  . Cholecalciferol (VITAMIN D PO) Take 2,000 Int'l Units by mouth daily.     Marland Kitchen losartan (COZAAR) 100 MG tablet 100 mg.   5  . Multiple Vitamins-Minerals (MULTIVITAMIN PO) Take 1 tablet by mouth daily.     . Omega-3 Fatty Acids (FISH OIL) 1000 MG CAPS Take by mouth.    . pantoprazole (PROTONIX) 40 MG tablet Take 40 mg by mouth as needed.    Marland Kitchen spironolactone (ALDACTONE) 25 MG tablet TAKE 1 TABLET BY MOUTH EVERY DAY (Patient not taking: Reported on 04/28/2019) 90 tablet 1  . Turmeric 400 MG CAPS Take by mouth.    . Vitamin D, Ergocalciferol, (DRISDOL) 1.25 MG (50000 UT) CAPS capsule Take 50,000 Units by mouth every 7 (seven) days.     No current facility-administered medications on file prior to visit.    Cardiovascular studies:  Echocardiogram 03/05/2019: Left ventricle cavity is normal in size. Mild concentric hypertrophy of the left ventricle. Normal LV systolic function with EF 55%. Normal global wall motion. Doppler evidence of grade I (impaired) diastolic dysfunction, normal LAP.  Mild tricuspid regurgitation. Estimated pulmonary artery systolic pressure is 30 mmHg.  Compared to previous study on 06/11/2017, LV filling pressures are pulmonary artery pressure are lower.   EKG 03/05/2019:  Sinus rhythm 65 bpm.  Lexiscan myoview stress test  06/07/2017:  1. Pharmacologic stress testing was performed with intravenous administration of .4 mg of Lexiscan over a 10-15 seconds infusion. Patient was hypertensive throughout the study.  Stress symptoms included dyspnea, flushing and headache. 2.  Exercise capacity not assessed. Stress EKG is non diagnostic for ischemia as it is a pharmacologic stress.  3. The overall quality of the study is excellent. There is no evidence of abnormal lung activity. Stress and rest SPECT images demonstrate homogeneous tracer distribution throughout the myocardium. Gated SPECT imaging reveals normal myocardial thickening and wall motion. The left ventricular ejection fraction was normal (52%).   4. This is a low risk study.  Pulmonary function test 05/20/2017: Diffusion defect consistent with pulmonary vascular process.  Normal FVC, FEV1, FEV1/FVC ratio and FEF 25-75%. FRC reduced  Recent labs: 08/15/2018: Glucose 109. BUN/Cr 0.8. eGFR 87. Na/K 141/4.3 AlkPhos 162 (25-150) H/H 13/43. MCV 87. Platelets 188.  Chol 226, TG 75, HDL 65, LDL 146.  HbA1C 6.4% TSH 3.0    09/27/2017: Glucose 122. BUN/Cr 17/0.81. eGFR 82. Na/K 139/4.7  Labs 05/29/2017: H/H 13/40. MCV 89. Platelets 260 Glucose 107. BUN/creatinine 15/0.7. EGFR 89/108 Cholesterol 222, triglycerides 59, HDL 70, LDL 140.  Labs 05/13/2017: H/H 13/40. MCV 89. Platelets 216 Glucose 99. BUN/Cr 20/0.7. eGFR 85/103. Na/K 139/4.1 BNP 196  Review of Systems  Constitution: Negative for weight gain.  Cardiovascular: Positive for dyspnea on exertion (Mild, stable). Negative for chest pain, leg swelling, palpitations and syncope.  Respiratory: Positive for shortness of breath.  Negative for cough.   All other systems reviewed and are negative.        Vitals:   06/05/19 1003 06/05/19 1009  BP: 105/63 107/67  Pulse: 80   Temp: (!) 97.3 F (36.3 C)   SpO2: 98%      Objective:    Physical Exam  Constitutional: She appears well-developed and  well-nourished. No distress.  Pulmonary/Chest: Effort normal.  Psychiatric: She has a normal mood and affect.  Nursing note and vitals reviewed.         Assessment & Recommendations:   79 year old African-American female with hypertension, morbid obesity, Heart failure with preserved ejection fraction.  HFpEF: NYHA class II symptoms. Continue aggressive blood pressure management. Conitnue spironolactone 25 mg daily, losartan 100 mg daily, amlodipine 5 mg daily.   Hypertension: Better controlled. Upcoming labs with PCP in April.  Will obtain results.  Hyperlipidemia: LDL 146, HDL 65 in 07/2018. ASCV risk 20%. Recommend low dose crestor 10 mg daily.  F/u in 6 months.   [name], MD [facility] Pager: [phone] Office: [phone] If no answer Cell [phone]

## 2019-06-06 ENCOUNTER — Encounter: Payer: Self-pay | Admitting: Cardiology

## 2019-07-20 ENCOUNTER — Encounter: Payer: Self-pay | Admitting: Obstetrics and Gynecology

## 2019-07-20 ENCOUNTER — Telehealth: Payer: Self-pay | Admitting: Obstetrics & Gynecology

## 2019-07-20 ENCOUNTER — Other Ambulatory Visit: Payer: Self-pay

## 2019-07-20 ENCOUNTER — Ambulatory Visit: Payer: Medicare HMO | Admitting: Obstetrics and Gynecology

## 2019-07-20 VITALS — BP 130/68 | HR 72 | Temp 97.2°F | Ht 66.0 in | Wt 262.0 lb

## 2019-07-20 DIAGNOSIS — L98491 Non-pressure chronic ulcer of skin of other sites limited to breakdown of skin: Secondary | ICD-10-CM

## 2019-07-20 NOTE — Progress Notes (Signed)
GYNECOLOGY  VISIT   HPI: 79 y.o.   Single Black or African American Not Hispanic or Latino  female   G1P1 with No LMP recorded. Patient is postmenopausal.   here for sore on her bottom. She says that she get blood when she wipes.  She had a colonoscopy last week. Afterwards she started to notice some soreness on her inner right buttock when she wiped, now it has started bleeding. Uncomfortable, tried neosporin a little, no help. She tried some Desitin, not sure it helped. She was nervous to do much until she was evaluated.  Not sexually active.  GYNECOLOGIC HISTORY: No LMP recorded. Patient is postmenopausal. Contraception:none Menopausal hormone therapy:none         OB History    Gravida  1   Para  1   Term      Preterm      AB      Living  1     SAB      TAB      Ectopic      Multiple      Live Births                 Patient Active Problem List   Diagnosis Date Noted  . Exertional dyspnea 01/21/2019  . Chronic heart failure with preserved ejection fraction ([place]) 01/21/2019  . Essential hypertension 10/29/2018  . Mixed hyperlipidemia 10/29/2018  . Sciatica 01/10/2015  . Left knee pain 12/23/2013    Past Medical History:  Diagnosis Date  . Anemia    h/o anemia  . Arthritis   . GERD (gastroesophageal reflux disease)    no meds  . Hyperlipidemia    borderline  . Hypertension   . Leg abrasion    had to go to the wound center-? diag  . Shortness of breath    due to her weight- per pt    Past Surgical History:  Procedure Laterality Date  . BIOPSY BREAST Left   . CATARACT EXTRACTION     11/16/14 and 12/13/14 (Dr. Herbert Deaner)  . DILATATION & CURRETTAGE/HYSTEROSCOPY WITH RESECTOCOPE N/A 12/07/2013   Procedure: [place];  Surgeon: Lyman Speller, MD;  Location: [facility];  Service: Gynecology;  Laterality: N/A;  . FOOT SURGERY  1/16   hamer toe repair  . ROTATOR CUFF REPAIR Right 05/24/2015  . TONSILLECTOMY     . TUBAL LIGATION      Current Outpatient Medications  Medication Sig Dispense Refill  . Acetaminophen (TYLENOL EXTRA STRENGTH PO) Take by mouth.    Marland Kitchen amLODipine (NORVASC) 5 MG tablet TAKE 1 TABLET BY MOUTH EVERY DAY 90 tablet 1  . Cholecalciferol (VITAMIN D PO) Take 2,000 Int'l Units by mouth daily.     Marland Kitchen losartan (COZAAR) 100 MG tablet 100 mg.   5  . Multiple Vitamins-Minerals (MULTIVITAMIN PO) Take 1 tablet by mouth daily.     . Omega-3 Fatty Acids (FISH OIL) 1000 MG CAPS Take by mouth.    . spironolactone (ALDACTONE) 25 MG tablet TAKE 1 TABLET BY MOUTH EVERY DAY 90 tablet 1  . Turmeric 400 MG CAPS Take by mouth.    . Vitamin D, Ergocalciferol, (DRISDOL) 1.25 MG (50000 UT) CAPS capsule Take 50,000 Units by mouth every 7 (seven) days.     No current facility-administered medications for this visit.     ALLERGIES: Patient has no known allergies.  Family History  Problem Relation Age of Onset  . Liver cancer Sister   .  Brain cancer Father        tumor  . Arthritis Mother   . Colon cancer Maternal Uncle   . Leukemia Maternal Uncle     Social History   Socioeconomic History  . Marital status: Single    Spouse name: Not on file  . Number of children: 1  . Years of education: Not on file  . Highest education level: Not on file  Occupational History  . Not on file  Tobacco Use  . Smoking status: Former Smoker    Packs/day: 0.25    Years: 15.00    Pack years: 3.75    Types: Cigarettes    Quit date: 2003    Years since quitting: 18.2  . Smokeless tobacco: Never Used  . Tobacco comment: years ago-not heavy/quit 2003/2004  Substance and Sexual Activity  . Alcohol use: Yes    Alcohol/week: 0.0 - 1.0 standard drinks    Comment: occ  . Drug use: No  . Sexual activity: Not Currently    Partners: Male  Other Topics Concern  . Not on file  Social History Narrative  . Not on file   Social Determinants of Health   Financial Resource Strain:   . Difficulty of Paying  Living Expenses:   Food Insecurity:   . Worried About Charity fundraiser in the Last Year:   . Arboriculturist in the Last Year:   Transportation Needs:   . Film/video editor (Medical):   Marland Kitchen Lack of Transportation (Non-Medical):   Physical Activity:   . Days of Exercise per Week:   . Minutes of Exercise per Session:   Stress:   . Feeling of Stress :   Social Connections:   . Frequency of Communication with Friends and Family:   . Frequency of Social Gatherings with Friends and Family:   . Attends Religious Services:   . Active Member of Clubs or Organizations:   . Attends Archivist Meetings:   Marland Kitchen Marital Status:   Intimate Partner Violence:   . Fear of Current or Ex-Partner:   . Emotionally Abused:   Marland Kitchen Physically Abused:   . Sexually Abused:     Review of Systems  All other systems reviewed and are negative.   PHYSICAL EXAMINATION:    BP 130/68   Pulse 72   Temp (!) 97.2 F (36.2 C)   Ht 5\' 6"  (1.676 m)   Wt 262 lb (118.8 kg)   SpO2 92%   BMI 42.29 kg/m     General appearance: alert, cooperative and appears stated age  Pelvic: External genitalia:  no lesions On the inner right buttocks she has an ~ 2 x 1 cm very superficial skin ulceration. No signs of infection.                Chaperone was present for exam.  ASSESSMENT Superficial skin ulceration on her inner right buttock    PLAN Discussed not over cleaning/rubbing Discussed cleaning with water Will use Vaseline and/or A&D ointment over the next week Keep open to the air as much as possible F/u with Dr Sabra Heck in one week   An After Visit Summary was printed and given to the patient.  CC: Dr Sabra Heck

## 2019-07-20 NOTE — Patient Instructions (Signed)
Skin Care and Bowel Hygiene   Anyone who has frequent bowel movements, diarrhea, or bowel leakage (fecal incontinence) may experience soreness or skin irritation around the anal region.  Occasionally, the skin can become so inflamed that it breaks into open sores.  Prevent skin breakdown by following good skin care habits.  Cleaning and Washing Techniques After having a bowel movement, men and women should tighten their anal sphincter before wiping.  Women should always wipe from front to back to prevent fecal matter from getting into the urethra and vagina.   Tips for Cleaning and Washing . wipe from front to back towards the anus . always wipe gently with soft toilet paper, or ideally with moist toilet paper . wipe only once with each piece of toilet paper so as not to re-contaminate the area . wash in warm water alone or with a minimal amount of mild, fragrance-free soap . use non-biological washing powder . gently pat skin completely dry, avoiding rubbing . if drying the skin after washing is difficult or uncomfortable, try using a hairdryer on a low setting (use very carefully) . allow air to get to the irritated area for some part of every day . use protective skin creams containing zinc as recommended by your doctor  What To Avoid . baths with extra-hot water . soaking for long periods of time in the bathtub . disinfectants and antiseptics  . bath oils, bath salts, and talcum powder . using plastic pants, pads, and sheets, which cause sweating . scratching at the irritated area  Additional Tips . some people find that citrus and acidic foods cause or worsen skin irritation . eat a healthy, balanced diet that is high in fiber  . drink plenty of fluids . wear cotton underwear to allow the skin to breath . talk to your healthcare provider about further treatment options; persistent problems need medical attention  

## 2019-07-20 NOTE — Telephone Encounter (Signed)
Spoke with patient. Patient reports a bleeding, sore area in between buttocks, unable to visualize. Has been applying neosporin with no change. Noticed area after colonoscopy 1 wk ago. Patient is concerned, would like area evaluated. Covid 19 prescreen negative, precautions reviewed. OV scheduled for today at 3:30pm with covering provider, Dr. Talbert Nan. Patient verbalizes understanding and is agreeable.   Last AEX 04/28/19 with Dr. Sabra Heck  Routing to provider for final review. Patient is agreeable to disposition. Will close encounter.

## 2019-07-20 NOTE — Telephone Encounter (Signed)
Patient had a colonoscopy a has developed a sore and is not sure what to do about this?

## 2019-07-23 ENCOUNTER — Ambulatory Visit: Payer: Medicare HMO | Admitting: Obstetrics & Gynecology

## 2019-07-28 NOTE — Progress Notes (Signed)
GYNECOLOGY  VISIT  CC:   Perirectal lesion  HPI: 79 y.o. G1P1 Single Black or [place] American female here for follow-up of superficial skin ulceration on left buttock.  Reports she's not having any bleeding for several days.  This started right after her colonoscopy and she reports this is the only thing that has been different in the past few.  Bowel movements are back to normal after the colonoscopy.    GYNECOLOGIC HISTORY: No LMP recorded. Patient is postmenopausal. Contraception: postmenopausal Menopausal hormone therapy: none  Patient Active Problem List   Diagnosis Date Noted  . Exertional dyspnea 01/21/2019  . Chronic heart failure with preserved ejection fraction ([place]) 01/21/2019  . Essential hypertension 10/29/2018  . Mixed hyperlipidemia 10/29/2018  . Sciatica 01/10/2015  . Left knee pain 12/23/2013    Past Medical History:  Diagnosis Date  . Anemia    h/o anemia  . Arthritis   . GERD (gastroesophageal reflux disease)    no meds  . Hyperlipidemia    borderline  . Hypertension   . Leg abrasion    had to go to the wound center-? diag  . Shortness of breath    due to her weight- per pt    Past Surgical History:  Procedure Laterality Date  . BIOPSY BREAST Left   . CATARACT EXTRACTION     11/16/14 and 12/13/14 (Dr. Herbert Deaner)  . DILATATION & CURRETTAGE/HYSTEROSCOPY WITH RESECTOCOPE N/A 12/07/2013   Procedure: [place];  Surgeon: Lyman Speller, MD;  Location: [facility];  Service: Gynecology;  Laterality: N/A;  . FOOT SURGERY  1/16   hamer toe repair  . ROTATOR CUFF REPAIR Right 05/24/2015  . TONSILLECTOMY    . TUBAL LIGATION      MEDS:   Current Outpatient Medications on File Prior to Visit  Medication Sig Dispense Refill  . Acetaminophen (TYLENOL EXTRA STRENGTH PO) Take by mouth.    Marland Kitchen amLODipine (NORVASC) 5 MG tablet TAKE 1 TABLET BY MOUTH EVERY DAY 90 tablet 1  . Cholecalciferol (VITAMIN D PO) Take 2,000 Int'l  Units by mouth daily.     Marland Kitchen losartan (COZAAR) 100 MG tablet 100 mg.   5  . Multiple Vitamins-Minerals (MULTIVITAMIN PO) Take 1 tablet by mouth daily.     . Omega-3 Fatty Acids (FISH OIL) 1000 MG CAPS Take by mouth.    . spironolactone (ALDACTONE) 25 MG tablet TAKE 1 TABLET BY MOUTH EVERY DAY 90 tablet 1  . Turmeric 400 MG CAPS Take by mouth.    . Vitamin D, Ergocalciferol, (DRISDOL) 1.25 MG (50000 UT) CAPS capsule Take 50,000 Units by mouth every 7 (seven) days.     No current facility-administered medications on file prior to visit.    ALLERGIES: Patient has no known allergies.  Family History  Problem Relation Age of Onset  . Liver cancer Sister   . Brain cancer Father        tumor  . Arthritis Mother   . Colon cancer Maternal Uncle   . Leukemia Maternal Uncle     SH:  Single, non smoker  Review of Systems  Constitutional: Negative.   HENT: Negative.   Eyes: Negative.   Respiratory: Negative.   Cardiovascular: Negative.   Gastrointestinal: Negative.   Endocrine: Negative.   Genitourinary: Negative.   Musculoskeletal: Negative.   Skin:       Sore on left buttock  Allergic/Immunologic: Negative.   Neurological: Negative.   Psychiatric/Behavioral: Negative.     PHYSICAL EXAMINATION:  Vitals:   07/30/19 0751  BP: 120/80  Pulse: 68  Resp: 16  Temp: 97.6 F (36.4 C)   General appearance: alert, cooperative and appears stated age Lymph:  no inguinal LAD noted  Pelvic: External genitalia:  no lesions              Urethra:  normal appearing urethra with no masses, tenderness or lesions              Bartholins and Skenes: normal                 Anus:  Skin lesion appears to be healing compared to prior note from Dr. Talbert Nan.  It is flat and no open wound present.  Photo documentation made.      Chaperone, Terence Lux, CMA, was present for exam.  Assessment: Buttocks lesion that appears to be healing compared to prior note  Plan: Will start using topical  neosporin and recheck 1 month to ensure fully healed.

## 2019-07-29 ENCOUNTER — Other Ambulatory Visit: Payer: Self-pay

## 2019-07-30 ENCOUNTER — Other Ambulatory Visit: Payer: Self-pay

## 2019-07-30 ENCOUNTER — Ambulatory Visit: Payer: Medicare HMO | Admitting: Obstetrics & Gynecology

## 2019-07-30 ENCOUNTER — Encounter: Payer: Self-pay | Admitting: Obstetrics & Gynecology

## 2019-07-30 VITALS — BP 120/80 | HR 68 | Temp 97.6°F | Resp 16 | Wt 263.0 lb

## 2019-07-30 DIAGNOSIS — L98491 Non-pressure chronic ulcer of skin of other sites limited to breakdown of skin: Secondary | ICD-10-CM | POA: Diagnosis not present

## 2019-07-30 NOTE — Patient Instructions (Signed)
[facility] and [place], [place] [id number]  Customer Service Center: [phone]  For COVID-19 questions call [phone]

## 2019-08-26 ENCOUNTER — Telehealth: Payer: Self-pay | Admitting: *Deleted

## 2019-08-26 NOTE — Telephone Encounter (Signed)
Patient cancelled follow up appointment because she is feeling better.

## 2019-09-03 ENCOUNTER — Ambulatory Visit: Payer: Medicare HMO | Admitting: Obstetrics & Gynecology

## 2019-11-05 ENCOUNTER — Ambulatory Visit
Admission: RE | Admit: 2019-11-05 | Discharge: 2019-11-05 | Disposition: A | Discharge status: Home / Self Care | Payer: Medicare HMO | Source: Ambulatory Visit | Attending: Internal Medicine | Admitting: Internal Medicine

## 2019-11-05 ENCOUNTER — Other Ambulatory Visit: Payer: Self-pay

## 2019-11-05 DIAGNOSIS — N6489 Other specified disorders of breast: Secondary | ICD-10-CM

## 2019-11-30 ENCOUNTER — Encounter (HOSPITAL_COMMUNITY): Payer: Self-pay

## 2019-11-30 ENCOUNTER — Ambulatory Visit (HOSPITAL_COMMUNITY)
Admission: EM | Admit: 2019-11-30 | Discharge: 2019-11-30 | Disposition: A | Discharge status: Home / Self Care | Payer: Medicare HMO

## 2019-11-30 ENCOUNTER — Other Ambulatory Visit: Payer: Self-pay

## 2019-11-30 DIAGNOSIS — S91332A Puncture wound without foreign body, left foot, initial encounter: Secondary | ICD-10-CM | POA: Diagnosis not present

## 2019-11-30 DIAGNOSIS — Z23 Encounter for immunization: Secondary | ICD-10-CM

## 2019-11-30 MED ORDER — TETANUS-DIPHTH-ACELL PERTUSSIS 5-2.5-18.5 LF-MCG/0.5 IM SUSP
0.5000 mL | Freq: Once | INTRAMUSCULAR | Status: AC
  Administered 2019-11-30: 0.5 mL via INTRAMUSCULAR

## 2019-11-30 MED ORDER — TETANUS-DIPHTH-ACELL PERTUSSIS 5-2.5-18.5 LF-MCG/0.5 IM SUSP
INTRAMUSCULAR | Status: AC
  Filled 2019-11-30: qty 0.5

## 2019-11-30 NOTE — ED Provider Notes (Signed)
[facility]    CSN: [id number] Arrival date & time: 11/30/19  1137      History   Chief Complaint Chief Complaint  Patient presents with  . Laceration    HPI Christine Munoz is a 79 y.o. female.   Patient is a 78 year old female presents today with puncture to bottom of the right foot heel area.  Reports possibly stepped on some glass on Saturday and unsure if there is still a piece of ice in her foot.  Mild pain to the area but able to ambulate.  Unsure of last tetanus shot.  Bleeding is controlled.  Denies any physical history of diabetes.  No redness or swelling at the site or drainage.  No fever.  ROS per HPI      Past Medical History:  Diagnosis Date  . Anemia    h/o anemia  . Arthritis   . GERD (gastroesophageal reflux disease)    no meds  . Hyperlipidemia    borderline  . Hypertension   . Leg abrasion    had to go to the wound center-? diag  . Shortness of breath    due to her weight- per pt    Patient Active Problem List   Diagnosis Date Noted  . Exertional dyspnea 01/21/2019  . Chronic heart failure with preserved ejection fraction ([place]) 01/21/2019  . Essential hypertension 10/29/2018  . Mixed hyperlipidemia 10/29/2018  . Sciatica 01/10/2015  . Left knee pain 12/23/2013    Past Surgical History:  Procedure Laterality Date  . BIOPSY BREAST Left   . CATARACT EXTRACTION     11/16/14 and 12/13/14 (Dr. Herbert Deaner)  . DILATATION & CURRETTAGE/HYSTEROSCOPY WITH RESECTOCOPE N/A 12/07/2013   Procedure: [place];  Surgeon: Lyman Speller, MD;  Location: [facility];  Service: Gynecology;  Laterality: N/A;  . FOOT SURGERY  1/16   hamer toe repair  . ROTATOR CUFF REPAIR Right 05/24/2015  . TONSILLECTOMY    . TUBAL LIGATION      OB History    Gravida  1   Para  1   Term      Preterm      AB      Living  1     SAB      TAB      Ectopic      Multiple      Live Births                Home Medications    Prior to Admission medications   Medication Sig Start Date End Date Taking? Authorizing Provider  Acetaminophen (TYLENOL EXTRA STRENGTH PO) Take by mouth.   Yes [provider]  amLODipine (NORVASC) 5 MG tablet TAKE 1 TABLET BY MOUTH EVERY DAY 01/20/19  Yes Patwardhan, Manish J, MD  Cholecalciferol (VITAMIN D PO) Take 2,000 Int'l Units by mouth daily.    Yes [provider]  losartan (COZAAR) 100 MG tablet 100 mg.  11/17/16  Yes [provider]  Multiple Vitamins-Minerals (MULTIVITAMIN PO) Take 1 tablet by mouth daily.    Yes [provider]  Omega-3 Fatty Acids (FISH OIL) 1000 MG CAPS Take by mouth.   Yes [provider]  rosuvastatin (CRESTOR) 10 MG tablet Take 10 mg by mouth daily.   Yes [provider]  spironolactone (ALDACTONE) 25 MG tablet TAKE 1 TABLET BY MOUTH EVERY DAY 04/15/19  Yes Patwardhan, Reynold Bowen, MD  Turmeric 400 MG CAPS Take by mouth.  Yes [provider]  Vitamin D, Ergocalciferol, (DRISDOL) 1.25 MG (50000 UT) CAPS capsule Take 50,000 Units by mouth every 7 (seven) days.   Yes [provider]    Family History Family History  Problem Relation Age of Onset  . Liver cancer Sister   . Brain cancer Father        tumor  . Arthritis Mother   . Colon cancer Maternal Uncle   . Leukemia Maternal Uncle     Social History Social History   Tobacco Use  . Smoking status: Former Smoker    Packs/day: 0.25    Years: 15.00    Pack years: 3.75    Types: Cigarettes    Quit date: 2003    Years since quitting: 18.6  . Smokeless tobacco: Never Used  . Tobacco comment: years ago-not heavy/quit 2003/2004  Vaping Use  . Vaping Use: Never used  Substance Use Topics  . Alcohol use: Yes    Alcohol/week: 0.0 - 1.0 standard drinks    Comment: occ  . Drug use: No     Allergies   Patient has no known allergies.   Review of Systems Review of Systems   Physical Exam Triage  Vital Signs ED Triage Vitals  Enc Vitals Group     BP 11/30/19 1353 (!) 153/86     Pulse Rate 11/30/19 1353 65     Resp 11/30/19 1353 18     Temp 11/30/19 1353 97.6 F (36.4 C)     Temp src --      SpO2 11/30/19 1353 100 %     Weight --      Height --      Head Circumference --      Peak Flow --      Pain Score 11/30/19 1350 2     Pain Loc --      Pain Edu? --      Excl. in [place]? --    No data found.  Updated Vital Signs BP (!) 153/86   Pulse 65   Temp 97.6 F (36.4 C)   Resp 18   SpO2 100%   Visual Acuity Right Eye Distance:   Left Eye Distance:   Bilateral Distance:    Right Eye Near:   Left Eye Near:    Bilateral Near:     Physical Exam Vitals and nursing note reviewed.  Constitutional:      General: She is not in acute distress.    Appearance: Normal appearance. She is not ill-appearing, toxic-appearing or diaphoretic.  HENT:     Head: Normocephalic.     Nose: Nose normal.  Eyes:     Conjunctiva/sclera: Conjunctivae normal.  Pulmonary:     Effort: Pulmonary effort is normal.  Musculoskeletal:        General: Normal range of motion.     Cervical back: Normal range of motion.       Feet:  Skin:    General: Skin is warm and dry.     Findings: No rash.  Neurological:     Mental Status: She is alert.  Psychiatric:        Mood and Affect: Mood normal.      UC Treatments / Results  Labs (all labs ordered are listed, but only abnormal results are displayed) Labs Reviewed - No data to display  EKG   Radiology No results found.  Procedures Procedures (including critical care time)  Medications Ordered in UC Medications  Tdap (BOOSTRIX) injection 0.5  mL (0.5 mLs Intramuscular Given 11/30/19 1451)    Initial Impression / Assessment and Plan / UC Course  I have reviewed the triage vital signs and the nursing notes.  Pertinent labs & imaging results that were available during my care of the patient were reviewed by me and considered in my  medical decision making (see chart for details).     Puncture wound of the right foot.  Explored the area. No glass seen or felt. Cleaned, abx ointment and wrapped foot.  Does not appear to be infected.  Return precautions given.  Tetanus updated.  Follow up as needed for continued or worsening symptoms  Final Clinical Impressions(s) / UC Diagnoses   Final diagnoses:  Puncture wound of left foot, initial encounter     Discharge Instructions     Keep clean and dry Antibiotic ointment.  Follow up as needed for continued or worsening symptoms     ED Prescriptions    None     PDMP not reviewed this encounter.   Orvan July, NP 11/30/19 1453

## 2019-11-30 NOTE — Discharge Instructions (Addendum)
Keep clean and dry Antibiotic ointment.  Follow up as needed for continued or worsening symptoms

## 2019-11-30 NOTE — ED Triage Notes (Signed)
Pt presents with small laceration present on the right heel. Reports she stepped on glass on Saturday. Pt was not aware she was cut until she saw the bleeding. Reports she does not know when her last tetanus shot was. Bleeding is controlled.

## 2019-12-03 ENCOUNTER — Encounter: Payer: Self-pay | Admitting: Cardiology

## 2019-12-03 ENCOUNTER — Ambulatory Visit: Payer: Medicare HMO | Admitting: Cardiology

## 2019-12-03 ENCOUNTER — Other Ambulatory Visit: Payer: Self-pay

## 2019-12-03 VITALS — BP 156/88 | HR 70 | Resp 16 | Ht 66.0 in | Wt 269.0 lb

## 2019-12-03 DIAGNOSIS — I5032 Chronic diastolic (congestive) heart failure: Secondary | ICD-10-CM

## 2019-12-03 DIAGNOSIS — E782 Mixed hyperlipidemia: Secondary | ICD-10-CM

## 2019-12-03 DIAGNOSIS — I10 Essential (primary) hypertension: Secondary | ICD-10-CM

## 2019-12-03 MED ORDER — ROSUVASTATIN CALCIUM 10 MG PO TABS: 10.0000 mg | ORAL_TABLET | Freq: Every day | ORAL | 3 refills | Status: DC

## 2019-12-03 NOTE — Progress Notes (Signed)
Subjective:   Christine Munoz, female    DOB: [birth year], 79 y.o.   MRN: [id number]   Chief complaint:  Hypertension   79 year old African-American female with hypertension, morbid obesity, Heart failure with preserved ejection fraction.  Patient is doing well.  Exertional dyspnea has improved.  Blood pressure is elevated today, but has been lower than this at home. Her activity has been limited due to knee pain. She has gained some weight. Reviewed lipid panel with the patient.    Current Outpatient Medications on File Prior to Visit  Medication Sig Dispense Refill  . Acetaminophen (TYLENOL EXTRA STRENGTH PO) Take by mouth.    Marland Kitchen amLODipine (NORVASC) 5 MG tablet TAKE 1 TABLET BY MOUTH EVERY DAY 90 tablet 1  . Cholecalciferol (VITAMIN D PO) Take 2,000 Int'l Units by mouth daily.     Marland Kitchen losartan (COZAAR) 100 MG tablet 100 mg.   5  . Multiple Vitamins-Minerals (MULTIVITAMIN PO) Take 1 tablet by mouth daily.     . Omega-3 Fatty Acids (FISH OIL) 1000 MG CAPS Take by mouth.    . rosuvastatin (CRESTOR) 10 MG tablet Take 10 mg by mouth daily.    Marland Kitchen spironolactone (ALDACTONE) 25 MG tablet TAKE 1 TABLET BY MOUTH EVERY DAY 90 tablet 1  . Turmeric 400 MG CAPS Take by mouth.    . Vitamin D, Ergocalciferol, (DRISDOL) 1.25 MG (50000 UT) CAPS capsule Take 50,000 Units by mouth every 7 (seven) days.     No current facility-administered medications on file prior to visit.    Cardiovascular studies:  EKG 12/03/2019: Sinus rhythm 71 bpm  Low voltage in precordial leads  Echocardiogram 03/05/2019: Left ventricle cavity is normal in size. Mild concentric hypertrophy of the left ventricle. Normal LV systolic function with EF 55%. Normal global wall motion. Doppler evidence of grade I (impaired) diastolic dysfunction, normal LAP.  Mild tricuspid regurgitation. Estimated pulmonary artery systolic pressure is 30 mmHg.  Compared to previous study on 06/11/2017, LV filling pressures are pulmonary  artery pressure are lower.   Lexiscan myoview stress test 06/07/2017:  1. Pharmacologic stress testing was performed with intravenous administration of .4 mg of Lexiscan over a 10-15 seconds infusion. Patient was hypertensive throughout the study.  Stress symptoms included dyspnea, flushing and headache. 2.  Exercise capacity not assessed. Stress EKG is non diagnostic for ischemia as it is a pharmacologic stress.  3. The overall quality of the study is excellent. There is no evidence of abnormal lung activity. Stress and rest SPECT images demonstrate homogeneous tracer distribution throughout the myocardium. Gated SPECT imaging reveals normal myocardial thickening and wall motion. The left ventricular ejection fraction was normal (52%).   4. This is a low risk study.  Pulmonary function test 05/20/2017: Diffusion defect consistent with pulmonary vascular process.  Normal FVC, FEV1, FEV1/FVC ratio and FEF 25-75%. FRC reduced  Recent labs:  08/12/2019: eGFR 64 Chol ?, TG 74, HDL ?, LDL 153.   08/15/2018: eGFR 64 H/H 13/43. MCV 87. Platelets 188.  Chol 226, TG 75, HDL 65, LDL 146.  HbA1C 6.4% TSH 3.0   09/27/2017: Glucose 122. BUN/Cr 17/0.81. eGFR 82. Na/K 139/4.7  Labs 05/29/2017: H/H 13/40. MCV 89. Platelets 260 Glucose 107. BUN/creatinine 15/0.7. EGFR 89/108 Cholesterol 222, triglycerides 59, HDL 70, LDL 140.  Labs 05/13/2017: H/H 13/40. MCV 89. Platelets 216 Glucose 99. BUN/Cr 20/0.7. eGFR 85/103. Na/K 139/4.1 BNP 196  Review of Systems  Constitutional: Negative for weight gain.  Cardiovascular: Negative for chest pain, dyspnea  on exertion (Mild, stable), leg swelling, palpitations and syncope.  Respiratory: Negative for cough and shortness of breath.   All other systems reviewed and are negative.        Vitals:   12/03/19 1028  BP: (!) 156/88  Pulse: 70  Resp: 16  SpO2: 96%     Objective:    Physical Exam Vitals and nursing note reviewed.  Constitutional:       General: She is not in acute distress.    Appearance: She is well-developed.  Pulmonary:     Effort: Pulmonary effort is normal.         Assessment & Recommendations:   79 year old African-American female with hypertension, morbid obesity, Heart failure with preserved ejection fraction.  HFpEF: Euvolumic. Continue aggressive blood pressure management.  Hypertension: BP elevated today. Currently spironolactone 25 mg daily, losartan 100 mg daily, amlodipine 5 mg daily.  Arranged for remote patient monitoring through pur pharmacist Manuela Schwartz.  Hyperlipidemia: LDL 146  HDL 65 in 07/2019. Increase rosuvastatin to 40 mg daily. Lipid panel in 3 months  F/u in 6 months.   [name], MD [facility] Pager: [phone] Office: [phone] If no answer Cell [phone]

## 2020-01-27 ENCOUNTER — Other Ambulatory Visit: Payer: Self-pay | Admitting: Cardiology

## 2020-01-27 DIAGNOSIS — R0600 Dyspnea, unspecified: Secondary | ICD-10-CM

## 2020-01-27 DIAGNOSIS — R0609 Other forms of dyspnea: Secondary | ICD-10-CM

## 2020-02-19 ENCOUNTER — Other Ambulatory Visit: Payer: Medicare HMO

## 2020-02-19 DIAGNOSIS — Z20822 Contact with and (suspected) exposure to covid-19: Secondary | ICD-10-CM

## 2020-02-20 LAB — SARS-COV-2, NAA 2 DAY TAT

## 2020-02-20 LAB — NOVEL CORONAVIRUS, NAA: SARS-CoV-2, NAA: NOT DETECTED

## 2020-04-20 ENCOUNTER — Other Ambulatory Visit: Payer: Self-pay | Admitting: Internal Medicine

## 2020-04-20 DIAGNOSIS — Z1231 Encounter for screening mammogram for malignant neoplasm of breast: Secondary | ICD-10-CM

## 2020-05-31 ENCOUNTER — Other Ambulatory Visit: Payer: Self-pay

## 2020-05-31 ENCOUNTER — Ambulatory Visit
Admission: RE | Admit: 2020-05-31 | Discharge: 2020-05-31 | Disposition: A | Discharge status: Home / Self Care | Payer: Medicare HMO | Source: Ambulatory Visit | Attending: Internal Medicine | Admitting: Internal Medicine

## 2020-05-31 DIAGNOSIS — Z1231 Encounter for screening mammogram for malignant neoplasm of breast: Secondary | ICD-10-CM

## 2020-06-03 NOTE — Progress Notes (Signed)
Subjective:   Christine Munoz, female    DOB: 06-Feb-1941, 80 y.o.   MRN: [id number]   Chief complaint:  Hypertension   80 year old African-American female with hypertension, morbid obesity, Heart failure with preserved ejection fraction.  Patient is doing well.  Her knee pain is improved after intra articular injections.  She denies chest pain, palpitations, leg edema, orthopnea, PND, TIA/syncope; has stable unchanged mild exertional dyspnea.  Blood pressure is well controlled today.  Reviewed recent lipid panel with the patient.  At last visit, I recommended increasing rosuvastatin from 10 mg to 40 mg daily.  However, it appears that patient has been taking 10 mg rosuvastatin for the most part, and taking 5 mg rosuvastatin in the last few weeks.  Current Outpatient Medications on File Prior to Visit  Medication Sig Dispense Refill  . Acetaminophen (TYLENOL EXTRA STRENGTH PO) Take by mouth.    Marland Kitchen amLODipine (NORVASC) 5 MG tablet TAKE 1 TABLET BY MOUTH EVERY DAY (Patient taking differently: 10 mg. ) 90 tablet 1  . Cholecalciferol (VITAMIN D PO) Take 2,000 Int'l Units by mouth daily.     Marland Kitchen losartan (COZAAR) 100 MG tablet 100 mg.   5  . Multiple Vitamins-Minerals (MULTIVITAMIN PO) Take 1 tablet by mouth daily.     . Omega-3 Fatty Acids (FISH OIL) 1000 MG CAPS Take by mouth.    . rosuvastatin (CRESTOR) 10 MG tablet Take 1 tablet (10 mg total) by mouth daily. 90 tablet 3  . spironolactone (ALDACTONE) 25 MG tablet TAKE 1 TABLET BY MOUTH EVERY DAY 90 tablet 1  . Turmeric 400 MG CAPS Take by mouth.    . Vitamin D, Ergocalciferol, (DRISDOL) 1.25 MG (50000 UT) CAPS capsule Take 50,000 Units by mouth every 7 (seven) days.     No current facility-administered medications on file prior to visit.    Cardiovascular studies:  EKG 06/06/2020: Sinus rhythm 77 bpm  Normal EKG  Echocardiogram 03/05/2019: Left ventricle cavity is normal in size. Mild concentric hypertrophy of the left  ventricle. Normal LV systolic function with EF 55%. Normal global wall motion. Doppler evidence of grade I (impaired) diastolic dysfunction, normal LAP.  Mild tricuspid regurgitation. Estimated pulmonary artery systolic pressure is 30 mmHg.  Compared to previous study on 06/11/2017, LV filling pressures are pulmonary artery pressure are lower.   Lexiscan myoview stress test 06/07/2017:  1. Pharmacologic stress testing was performed with intravenous administration of .4 mg of Lexiscan over a 10-15 seconds infusion. Patient was hypertensive throughout the study.  Stress symptoms included dyspnea, flushing and headache. 2.  Exercise capacity not assessed. Stress EKG is non diagnostic for ischemia as it is a pharmacologic stress.  3. The overall quality of the study is excellent. There is no evidence of abnormal lung activity. Stress and rest SPECT images demonstrate homogeneous tracer distribution throughout the myocardium. Gated SPECT imaging reveals normal myocardial thickening and wall motion. The left ventricular ejection fraction was normal (52%).   4. This is a low risk study.  Pulmonary function test 05/20/2017: Diffusion defect consistent with pulmonary vascular process.  Normal FVC, FEV1, FEV1/FVC ratio and FEF 25-75%. FRC reduced  Recent labs:  02/22/2020: Glucose 122, BUN/Cr 17/0.8. EGFR 85. HbA1C N/A% Chol N/A, TG 95, HDL 43, LDL 67 TSH 3.0 normal  08/12/2019: eGFR 64 Chol ?, TG 74, HDL ?, LDL 153.   08/15/2018: eGFR 64 H/H 13/43. MCV 87. Platelets 188.  Chol 226, TG 75, HDL 65, LDL 146.  HbA1C 6.4% TSH 3.0  09/27/2017: Glucose 122. BUN/Cr 17/0.81. eGFR 82. Na/K 139/4.7  Labs 05/29/2017: H/H 13/40. MCV 89. Platelets 260 Glucose 107. BUN/creatinine 15/0.7. EGFR 89/108 Cholesterol 222, triglycerides 59, HDL 70, LDL 140.  Labs 05/13/2017: H/H 13/40. MCV 89. Platelets 216 Glucose 99. BUN/Cr 20/0.7. eGFR 85/103. Na/K 139/4.1 BNP 196  Review of Systems  Constitutional:  Negative for weight gain.  Cardiovascular: Negative for chest pain, dyspnea on exertion (Mild, stable), leg swelling, palpitations and syncope.  Respiratory: Negative for cough and shortness of breath.   All other systems reviewed and are negative.        Vitals:   06/06/20 1026  BP: 130/77  Pulse: 76  Resp: 16  Temp: 97.7 F (36.5 C)  SpO2: 96%     Objective:    Physical Exam Vitals and nursing note reviewed.  Constitutional:      General: She is not in acute distress.    Appearance: She is well-developed.  Pulmonary:     Effort: Pulmonary effort is normal.         Assessment & Recommendations:   80 year old African-American female with hypertension, morbid obesity, Heart failure with preserved ejection fraction.  HFpEF: Euvolumic. Continue aggressive blood pressure management.  Hypertension: Well-controlled on spironolactone 25 mg daily, losartan 100 mg daily, amlodipine 5 mg daily.    Hyperlipidemia: LDL down to 67 in 11/201 Continue rosuvastatin at 10 mg daily.    Lipid panel in 6 months F/u in 1 year  [name], MD [facility] Pager: [phone] Office: [phone] If no answer Cell [phone]

## 2020-06-06 ENCOUNTER — Other Ambulatory Visit: Payer: Self-pay

## 2020-06-06 ENCOUNTER — Encounter: Payer: Self-pay | Admitting: Cardiology

## 2020-06-06 ENCOUNTER — Ambulatory Visit: Payer: Medicare HMO | Admitting: Cardiology

## 2020-06-06 VITALS — BP 130/77 | HR 76 | Temp 97.7°F | Resp 16 | Ht 66.0 in | Wt 266.6 lb

## 2020-06-06 DIAGNOSIS — E782 Mixed hyperlipidemia: Secondary | ICD-10-CM

## 2020-06-06 DIAGNOSIS — I5032 Chronic diastolic (congestive) heart failure: Secondary | ICD-10-CM

## 2020-06-06 DIAGNOSIS — I10 Essential (primary) hypertension: Secondary | ICD-10-CM

## 2020-06-06 MED ORDER — ROSUVASTATIN CALCIUM 10 MG PO TABS: 10.0000 mg | ORAL_TABLET | Freq: Every day | ORAL | 3 refills | Status: DC

## 2020-07-08 ENCOUNTER — Ambulatory Visit: Payer: Medicare HMO

## 2020-08-16 ENCOUNTER — Other Ambulatory Visit: Payer: Self-pay | Admitting: Cardiology

## 2020-08-16 DIAGNOSIS — I10 Essential (primary) hypertension: Secondary | ICD-10-CM

## 2020-08-17 ENCOUNTER — Ambulatory Visit (HOSPITAL_BASED_OUTPATIENT_CLINIC_OR_DEPARTMENT_OTHER): Payer: Medicare HMO | Admitting: Obstetrics & Gynecology

## 2020-10-20 ENCOUNTER — Other Ambulatory Visit: Payer: Self-pay | Admitting: Cardiology

## 2020-10-20 DIAGNOSIS — R0600 Dyspnea, unspecified: Secondary | ICD-10-CM

## 2020-10-20 DIAGNOSIS — R0609 Other forms of dyspnea: Secondary | ICD-10-CM

## 2020-11-17 ENCOUNTER — Ambulatory Visit (HOSPITAL_BASED_OUTPATIENT_CLINIC_OR_DEPARTMENT_OTHER): Payer: Medicare HMO | Admitting: Obstetrics & Gynecology

## 2020-12-05 ENCOUNTER — Other Ambulatory Visit: Payer: Self-pay

## 2020-12-05 ENCOUNTER — Ambulatory Visit: Payer: Medicare HMO | Admitting: Cardiology

## 2020-12-05 ENCOUNTER — Encounter: Payer: Self-pay | Admitting: Cardiology

## 2020-12-05 VITALS — BP 144/84 | HR 69 | Temp 98.6°F | Resp 16 | Ht 66.0 in | Wt 265.0 lb

## 2020-12-05 DIAGNOSIS — I10 Essential (primary) hypertension: Secondary | ICD-10-CM

## 2020-12-05 DIAGNOSIS — E782 Mixed hyperlipidemia: Secondary | ICD-10-CM

## 2020-12-05 DIAGNOSIS — I5032 Chronic diastolic (congestive) heart failure: Secondary | ICD-10-CM

## 2020-12-05 NOTE — Progress Notes (Signed)
Subjective:   Christine Munoz, female    DOB: May 15, 1940, 80 y.o.   MRN: [id number]   Chief complaint:  Hypertension   80 year old African-American female with hypertension, morbid obesity, Heart failure with preserved ejection fraction.  Patient has no cardiac complaints. She has been dealing with left knee pain. This has limited her walking. Blood pressure is fairly well controlled. Reviewed recent labs with the patient, details below.   Current Outpatient Medications on File Prior to Visit  Medication Sig Dispense Refill   Acetaminophen (TYLENOL EXTRA STRENGTH PO) Take by mouth.     amLODipine (NORVASC) 5 MG tablet TAKE 1 TABLET BY MOUTH EVERY DAY 90 tablet 1   Black Pepper-Turmeric (TURMERIC CURCUMIN) 08-998 MG CAPS See admin instructions.     Cholecalciferol (VITAMIN D PO) Take 2,000 Int'l Units by mouth daily.      losartan (COZAAR) 100 MG tablet 100 mg.   5   Multiple Vitamins-Minerals (MULTIVITAMIN PO) Take 1 tablet by mouth daily.      Omega-3 Fatty Acids (FISH OIL) 1000 MG CAPS Take by mouth.     pantoprazole (PROTONIX) 40 MG tablet Take 1 tablet by mouth daily.     rosuvastatin (CRESTOR) 10 MG tablet Take 1 tablet (10 mg total) by mouth daily. 90 tablet 3   spironolactone (ALDACTONE) 25 MG tablet TAKE 1 TABLET BY MOUTH EVERY DAY 90 tablet 1   Vitamin D, Ergocalciferol, (DRISDOL) 1.25 MG (50000 UT) CAPS capsule Take 50,000 Units by mouth every 7 (seven) days.     No current facility-administered medications on file prior to visit.    Cardiovascular studies:  EKG 12/05/2020: Sinus rhythm 76 bpm Low voltage in precordial leads  Echocardiogram 03/05/2019: Left ventricle cavity is normal in size. Mild concentric hypertrophy of the left ventricle. Normal LV systolic function with EF 55%. Normal global wall motion. Doppler evidence of grade I (impaired) diastolic dysfunction, normal LAP.  Mild tricuspid regurgitation. Estimated pulmonary artery systolic pressure is  30 mmHg.  Compared to previous study on 06/11/2017, LV filling pressures are pulmonary artery pressure are lower.   Lexiscan myoview stress test 06/07/2017:  1. Pharmacologic stress testing was performed with intravenous administration of .4 mg of Lexiscan over a 10-15 seconds infusion. Patient was hypertensive throughout the study.  Stress symptoms included dyspnea, flushing and headache. 2.  Exercise capacity not assessed. Stress EKG is non diagnostic for ischemia as it is a pharmacologic stress.  3. The overall quality of the study is excellent. There is no evidence of abnormal lung activity. Stress and rest SPECT images demonstrate homogeneous tracer distribution throughout the myocardium. Gated SPECT imaging reveals normal myocardial thickening and wall motion. The left ventricular ejection fraction was normal (52%).   4. This is a low risk study.  Pulmonary function test 05/20/2017: Diffusion defect consistent with pulmonary vascular process.  Normal FVC, FEV1, FEV1/FVC ratio and FEF 25-75%. FRC reduced  Recent labs:  09/09/2020: Glucose 110, BUN/Cr 14/0.76. EGFR 81. Na/K 139/4.6. Rest of the CMP normal H/H 13/44. MCV 90. Platelets 221 HbA1C 6.1% Chol 137, TG 58, HDL 51, LDL 74  02/22/2020: Glucose 122, BUN/Cr 17/0.8. EGFR 85. HbA1C N/A% Chol N/A, TG 95, HDL 43, LDL 67 TSH 3.0 normal  08/12/2019: eGFR 64 Chol ?, TG 74, HDL ?, LDL 153.   08/15/2018: eGFR 64 H/H 13/43. MCV 87. Platelets 188.  Chol 226, TG 75, HDL 65, LDL 146.  HbA1C 6.4% TSH 3.0   09/27/2017: Glucose 122. BUN/Cr 17/0.81. eGFR 82.  Na/K 139/4.7  Labs 05/29/2017: H/H 13/40. MCV 89. Platelets 260 Glucose 107. BUN/creatinine 15/0.7. EGFR 89/108 Cholesterol 222, triglycerides 59, HDL 70, LDL 140.  Labs 05/13/2017: H/H 13/40. MCV 89. Platelets 216 Glucose 99. BUN/Cr 20/0.7. eGFR 85/103. Na/K 139/4.1 BNP 196  Review of Systems  Constitutional: Negative for weight gain.  Cardiovascular:  Negative for chest  pain, dyspnea on exertion (Mild, stable), leg swelling, palpitations and syncope.  Respiratory:  Negative for cough and shortness of breath.   Musculoskeletal:  Positive for joint pain.  All other systems reviewed and are negative.       Vitals:   12/05/20 1203  BP: (!) 144/84  Pulse: 69  Resp: 16  Temp: 98.6 F (37 C)  SpO2: 96%     Objective:    Physical Exam Vitals and nursing note reviewed.  Constitutional:      General: She is not in acute distress.    Appearance: She is well-developed.  Pulmonary:     Effort: Pulmonary effort is normal.        Assessment & Recommendations:   80 year old African-American female with hypertension, morbid obesity, Heart failure with preserved ejection fraction.  HFpEF: Euvolumic. Continue current hypertension management  Hypertension: Well controlled  Hyperlipidemia: Well controlled on Crestor 5 mg  F/u in 1 year  Geneva Barrero Esther Hardy, MD [facility] Pager: [phone] Office: [phone] If no answer Cell [phone]

## 2021-02-03 ENCOUNTER — Ambulatory Visit (INDEPENDENT_AMBULATORY_CARE_PROVIDER_SITE_OTHER): Payer: Medicare HMO | Admitting: Obstetrics & Gynecology

## 2021-02-03 ENCOUNTER — Other Ambulatory Visit: Payer: Self-pay

## 2021-02-03 ENCOUNTER — Encounter (HOSPITAL_BASED_OUTPATIENT_CLINIC_OR_DEPARTMENT_OTHER): Payer: Self-pay | Admitting: Obstetrics & Gynecology

## 2021-02-03 VITALS — Ht 65.25 in | Wt 267.0 lb

## 2021-02-03 DIAGNOSIS — N840 Polyp of corpus uteri: Secondary | ICD-10-CM

## 2021-02-03 DIAGNOSIS — Z01419 Encounter for gynecological examination (general) (routine) without abnormal findings: Secondary | ICD-10-CM | POA: Diagnosis not present

## 2021-02-03 DIAGNOSIS — Z780 Asymptomatic menopausal state: Secondary | ICD-10-CM

## 2021-02-03 NOTE — Progress Notes (Signed)
80 y.o. [place] American female here for breast and pelvic exam.  Denies vaginal bleeding or odor.  Mother was inducted into the [place] [employer].  She and her sister accepted this award.  Was really special event.   No LMP recorded. Patient is postmenopausal.          Sexually active: No.  H/O STD:  no  Health Maintenance: PCP:  Dr. Theda Sers.  Last wellness appt was 02/2021.  Did blood work at that appt:  yes Vaccines are up to date:  yes Colonoscopy:  07/07/19 MMG:  05/31/20 neg BMD:  with Dr. Julianne Rice office Last pap smear:  02/04/17 neg.   H/o abnormal pap smear:      reports that she quit smoking about 19 years ago. Her smoking use included cigarettes. She has a 3.75 pack-year smoking history. She has never used smokeless tobacco. She reports current alcohol use. She reports that she does not use drugs.  Past Medical History:  Diagnosis Date   Anemia    h/o anemia   Arthritis    GERD (gastroesophageal reflux disease)    no meds   Hyperlipidemia    borderline   Hypertension    Leg abrasion    had to go to the wound center-? diag   Shortness of breath    due to her weight- per pt    Past Surgical History:  Procedure Laterality Date   BIOPSY BREAST Left    CATARACT EXTRACTION     11/16/14 and 12/13/14 (Dr. Herbert Deaner)   [place] N/A 12/07/2013   Procedure: [place];  Surgeon: Lyman Speller, MD;  Location: [facility];  Service: Gynecology;  Laterality: N/A;   FOOT SURGERY  1/16   hamer toe repair   ROTATOR CUFF REPAIR Right 05/24/2015   TONSILLECTOMY     TUBAL LIGATION      Current Outpatient Medications  Medication Sig Dispense Refill   Acetaminophen (TYLENOL EXTRA STRENGTH PO) Take by mouth.     amLODipine (NORVASC) 5 MG tablet TAKE 1 TABLET BY MOUTH EVERY DAY 90 tablet 1   Black Pepper-Turmeric (TURMERIC CURCUMIN) 08-998 MG CAPS See admin instructions.      Cholecalciferol (VITAMIN D PO) Take 2,000 Int'l Units by mouth daily.      losartan (COZAAR) 100 MG tablet 100 mg.   5   Multiple Vitamins-Minerals (CENTRUM SILVER PO) Take by mouth.     Omega-3 Fatty Acids (FISH OIL) 1000 MG CAPS Take by mouth.     pantoprazole (PROTONIX) 40 MG tablet Take 1 tablet by mouth daily.     rosuvastatin (CRESTOR) 10 MG tablet Take 1 tablet (10 mg total) by mouth daily. (Patient taking differently: Take 5 mg by mouth daily.) 90 tablet 3   spironolactone (ALDACTONE) 25 MG tablet TAKE 1 TABLET BY MOUTH EVERY DAY 90 tablet 1   Vitamin D, Ergocalciferol, (DRISDOL) 1.25 MG (50000 UT) CAPS capsule Take 50,000 Units by mouth every 7 (seven) days.     No current facility-administered medications for this visit.    Family History  Problem Relation Age of Onset   Liver cancer Sister    Brain cancer Father        tumor   Arthritis Mother    Colon cancer Maternal Uncle    Leukemia Maternal Uncle     Review of Systems  All other systems reviewed and are negative.  Exam:   Ht 5'  5.25" (1.657 m)   Wt 267 lb (121.1 kg)   BMI 44.09 kg/m   Height: 5' 5.25" (165.7 cm)  General appearance: alert, cooperative and appears stated age Breasts: normal appearance, no masses or tenderness Abdomen: soft, non-tender; bowel sounds normal; no masses,  no organomegaly Lymph nodes: Cervical, supraclavicular, and axillary nodes normal.  No abnormal inguinal nodes palpated Neurologic: Grossly normal  Pelvic: External genitalia:  no lesions              Urethra:  normal appearing urethra with no masses, tenderness or lesions              Bartholins and Skenes: normal                 Vagina: normal appearing vagina with atrophic changes and no discharge, no lesions              Cervix: no lesions              Pap taken: No. Bimanual Exam:  Uterus:  normal size, contour, position, consistency, mobility, non-tender              Adnexa: normal adnexa and no mass, fullness,  tenderness               Rectovaginal: Confirms               Anus:  normal sphincter tone, no lesions  Chaperone, Ezekiel Ina, RN, was present for exam.  Assessment/Plan: 1. Encntr for gyn exam (general) (routine) w/o abn findings - pap smear not indicated - mammogram up to date - colon cancer screening up to date - lab work done with [facility] - Care gaps updated/reviewed  2. Postmenopausal - no HRT  3. Endometrial polyp - s/p resection with D&C 2015

## 2021-02-20 ENCOUNTER — Encounter: Payer: Self-pay | Admitting: Pharmacist

## 2021-02-20 NOTE — Progress Notes (Signed)
CARE PLAN ENTRY  02/20/2021 Name: Christine Munoz MRN: [id number] DOB: 04/06/1941  Christine Munoz is enrolled in Remote Patient Monitoring/Principle Care Monitoring.  Date of Enrollment: 12/02/20 Supervising physician: Vernell Leep Indication: HTN  Remote Readings: Compliant and Avg BP: 140/90, HR:64  Next scheduled OV: 12/06/21  Pharmacist Clinical Goal(s):  Over the next 90 days, patient will demonstrate Improved medication adherence as evidenced by medication fill history Over the next 90 days, patient will demonstrate improved understanding of prescribed medications and rationale for usage as evidenced by patient teach back Over the next 90 days, patient will experience decrease in ED visits. ED visits in last 6 months =  Over the next 90 days, patient will not experience hospital admission. Hospital Admissions in last 6 months = 0  Interventions: Provider and Inter-disciplinary care team collaboration (see longitudinal plan of care) Comprehensive medication review performed. Discussed plans with patient for ongoing care management follow up and provided patient with direct contact information for care management team Collaboration with provider re: medication management  Patient Self Care Activities:  Self administers medications as prescribed Attends all scheduled provider appointments Performs ADL's independently Performs IADL's independently  No Known Allergies Outpatient Encounter Medications as of 02/20/2021  Medication Sig   Acetaminophen (TYLENOL EXTRA STRENGTH PO) Take by mouth.   amLODipine (NORVASC) 5 MG tablet TAKE 1 TABLET BY MOUTH EVERY DAY   Black Pepper-Turmeric (TURMERIC CURCUMIN) 08-998 MG CAPS See admin instructions.   Cholecalciferol (VITAMIN D PO) Take 2,000 Int'l Units by mouth daily.    losartan (COZAAR) 100 MG tablet 100 mg.    Multiple Vitamins-Minerals (CENTRUM SILVER PO) Take by mouth.   Omega-3 Fatty Acids (FISH OIL) 1000 MG  CAPS Take by mouth.   pantoprazole (PROTONIX) 40 MG tablet Take 1 tablet by mouth daily.   rosuvastatin (CRESTOR) 10 MG tablet Take 1 tablet (10 mg total) by mouth daily. (Patient taking differently: Take 5 mg by mouth daily.)   spironolactone (ALDACTONE) 25 MG tablet TAKE 1 TABLET BY MOUTH EVERY DAY   Vitamin D, Ergocalciferol, (DRISDOL) 1.25 MG (50000 UT) CAPS capsule Take 50,000 Units by mouth every 7 (seven) days.   No facility-administered encounter medications on file as of 02/20/2021.    Hypertension   BP goal is:  <140/90  Office blood pressures are  BP Readings from Last 3 Encounters:  12/05/20 (!) 144/84  06/06/20 130/77  12/03/19 (!) 156/88    Patient is currently controlled on the following medications: spironolactone 25 mg, amlodipine 5 mg, losartan 1000 mg  Patient checks BP at home daily  Patient home BP readings are ranging: 118-157/73-100   We discussed diet and exercise extensively  Plan  Continue current medications and control with diet and exercise   ______________ Visit Information SDOH (Social Determinants of Health) assessments performed: Yes.  Christine Munoz was given information about Principle Care Management/Remote Patient Monitoring services today including:  RPM/PCM service includes personalized support from designated clinical staff supervised by her physician, including individualized plan of care and coordination with other care providers 24/7 contact phone numbers for assistance for urgent and routine care needs. Standard insurance, coinsurance, copays and deductibles apply for principle care management only during months in which we provide at least 30 minutes of these services. Most insurances cover these services at 100%, however patients may be responsible for any copay, coinsurance and/or deductible if applicable. This service may help you avoid the need for more expensive face-to-face services. Only one practitioner may furnish and  bill the  service in a calendar month. The patient may stop PCM/RPM services at any time (effective at the end of the month) by phone call to the office staff.  Patient agreed to services and verbal consent obtained.   Christine Munoz, Pharm.D. [facility] [phone] Ext: [phone]

## 2021-03-22 ENCOUNTER — Other Ambulatory Visit: Payer: Self-pay | Admitting: Cardiology

## 2021-03-22 DIAGNOSIS — R0609 Other forms of dyspnea: Secondary | ICD-10-CM

## 2021-04-18 ENCOUNTER — Telehealth: Payer: Self-pay

## 2021-04-20 NOTE — Telephone Encounter (Signed)
Reviewed with pt. Pt planing on coming by the office to trouble shoot her BP monitor

## 2021-04-24 ENCOUNTER — Other Ambulatory Visit: Payer: Self-pay | Admitting: Cardiology

## 2021-04-24 DIAGNOSIS — I10 Essential (primary) hypertension: Secondary | ICD-10-CM

## 2021-12-06 ENCOUNTER — Ambulatory Visit: Payer: Medicare HMO | Admitting: Cardiology

## 2021-12-06 ENCOUNTER — Encounter: Payer: Self-pay | Admitting: Cardiology

## 2021-12-06 VITALS — BP 123/75 | HR 71 | Temp 98.0°F | Resp 16 | Ht 65.0 in | Wt 268.0 lb

## 2021-12-06 DIAGNOSIS — I10 Essential (primary) hypertension: Secondary | ICD-10-CM

## 2021-12-06 DIAGNOSIS — E782 Mixed hyperlipidemia: Secondary | ICD-10-CM

## 2021-12-06 MED ORDER — AMLODIPINE BESYLATE 2.5 MG PO TABS: 2.5000 mg | ORAL_TABLET | Freq: Every day | ORAL | 3 refills | Status: DC

## 2021-12-06 NOTE — Progress Notes (Addendum)
Subjective:   Christine Munoz, female    DOB: 02/14/1941, 81 y.o.   MRN: [id number]   Chief complaint:  Hypertension   81 year old African-American female with hypertension, morbid obesity, Heart failure with preserved ejection fraction.  Patient has no cardiac complaints.  Exertional dyspnea is mild and unchanged.  She continues to have difficulty with knee pain, which has limited her physical activity.  Blood pressure slightly elevated today. Reviewed recent test results with the patient, details below.    Current Outpatient Medications:    Acetaminophen (TYLENOL EXTRA STRENGTH PO), Take by mouth., Disp: , Rfl:    amLODipine (NORVASC) 5 MG tablet, TAKE 1 TABLET BY MOUTH EVERY DAY, Disp: 90 tablet, Rfl: 1   Black Pepper-Turmeric (TURMERIC CURCUMIN) 08-998 MG CAPS, See admin instructions., Disp: , Rfl:    Cholecalciferol (VITAMIN D PO), Take 2,000 Int'l Units by mouth daily. , Disp: , Rfl:    losartan (COZAAR) 100 MG tablet, 100 mg. , Disp: , Rfl: 5   Multiple Vitamins-Minerals (CENTRUM SILVER PO), Take by mouth., Disp: , Rfl:    Omega-3 Fatty Acids (FISH OIL) 1000 MG CAPS, Take by mouth., Disp: , Rfl:    pantoprazole (PROTONIX) 40 MG tablet, Take 1 tablet by mouth daily., Disp: , Rfl:    rosuvastatin (CRESTOR) 10 MG tablet, Take 1 tablet (10 mg total) by mouth daily. (Patient taking differently: Take 5 mg by mouth daily.), Disp: 90 tablet, Rfl: 3   spironolactone (ALDACTONE) 25 MG tablet, TAKE 1 TABLET BY MOUTH EVERY DAY, Disp: 90 tablet, Rfl: 1   Vitamin D, Ergocalciferol, (DRISDOL) 1.25 MG (50000 UT) CAPS capsule, Take 50,000 Units by mouth every 7 (seven) days., Disp: , Rfl:     Cardiovascular studies:  EKG 12/05/2020: Sinus rhythm 76 bpm Low voltage in precordial leads  Echocardiogram 03/05/2019: Left ventricle cavity is normal in size. Mild concentric hypertrophy of the left ventricle. Normal LV systolic function with EF 55%. Normal global wall motion. Doppler  evidence of grade I (impaired) diastolic dysfunction, normal LAP.  Mild tricuspid regurgitation. Estimated pulmonary artery systolic pressure is 30 mmHg.  Compared to previous study on 06/11/2017, LV filling pressures are pulmonary artery pressure are lower.   Lexiscan myoview stress test 06/07/2017:  1. Pharmacologic stress testing was performed with intravenous administration of .4 mg of Lexiscan over a 10-15 seconds infusion. Patient was hypertensive throughout the study.  Stress symptoms included dyspnea, flushing and headache. 2.  Exercise capacity not assessed. Stress EKG is non diagnostic for ischemia as it is a pharmacologic stress.  3. The overall quality of the study is excellent. There is no evidence of abnormal lung activity. Stress and rest SPECT images demonstrate homogeneous tracer distribution throughout the myocardium. Gated SPECT imaging reveals normal myocardial thickening and wall motion. The left ventricular ejection fraction was normal (52%).   4. This is a low risk study.  Pulmonary function test 05/20/2017: Diffusion defect consistent with pulmonary vascular process.  Normal FVC, FEV1, FEV1/FVC ratio and FEF 25-75%. FRC reduced  Recent labs:  09/09/2020: Glucose 110, BUN/Cr 14/0.76. EGFR 81. Na/K 139/4.6. Rest of the CMP normal H/H 13/44. MCV 90. Platelets 221 HbA1C 6.1% Chol 137, TG 58, HDL 51, LDL 74  02/22/2020: Glucose 122, BUN/Cr 17/0.8. EGFR 85. HbA1C N/A% Chol N/A, TG 95, HDL 43, LDL 67 TSH 3.0 normal  08/12/2019: eGFR 64 Chol ?, TG 74, HDL ?, LDL 153.   08/15/2018: eGFR 64 H/H 13/43. MCV 87. Platelets 188.  Chol 226, TG  75, HDL 65, LDL 146.  HbA1C 6.4% TSH 3.0   09/27/2017: Glucose 122. BUN/Cr 17/0.81. eGFR 82. Na/K 139/4.7  Labs 05/29/2017: H/H 13/40. MCV 89. Platelets 260 Glucose 107. BUN/creatinine 15/0.7. EGFR 89/108 Cholesterol 222, triglycerides 59, HDL 70, LDL 140.  Labs 05/13/2017: H/H 13/40. MCV 89. Platelets 216 Glucose 99. BUN/Cr  20/0.7. eGFR 85/103. Na/K 139/4.1 BNP 196  Review of Systems  Constitutional: Negative for weight gain.  Cardiovascular:  Negative for chest pain, dyspnea on exertion (Mild, stable), leg swelling, palpitations and syncope.  Respiratory:  Negative for cough and shortness of breath.   Musculoskeletal:  Positive for joint pain.  All other systems reviewed and are negative.        Vitals:   12/06/21 1023  BP: (!) 142/73  Pulse: 67  Resp: 16  Temp: 98 F (36.7 C)  SpO2: 96%   Filed Weights   12/06/21 1023  Weight: 268 lb (121.6 kg)     Objective:    Physical Exam Vitals and nursing note reviewed.  Constitutional:      General: She is not in acute distress.    Appearance: She is well-developed. She is obese.  Pulmonary:     Effort: Pulmonary effort is normal.  Musculoskeletal:     Right lower leg: No edema.     Left lower leg: No edema.         Assessment & Recommendations:   81 year old African-American female with hypertension, morbid obesity, Heart failure with preserved ejection fraction.  HFpEF: Euvolumic. Continue current hypertension management  Hypertension: Well controlled  Hyperlipidemia: Currently on Cresto 5 mg. Check lipid panel  F/u in 6 months to discuss lipid panel results.   [name], MD [facility] Pager: [phone] Office: [phone] If no answer Cell [phone]

## 2021-12-08 ENCOUNTER — Encounter: Payer: Self-pay | Admitting: Cardiology

## 2021-12-20 LAB — LIPID PANEL
Chol/HDL Ratio: 3.3 ratio (ref 0.0–4.4)
Cholesterol, Total: 163 mg/dL (ref 100–199)
HDL: 49 mg/dL (ref >39)
LDL Chol Calc (NIH): 99 mg/dL (ref 0–99)
Triglycerides: 80 mg/dL (ref 0–149)
VLDL Cholesterol Cal: 15 mg/dL (ref 5–40)

## 2022-01-11 ENCOUNTER — Encounter: Payer: Self-pay | Admitting: Cardiology

## 2022-01-11 ENCOUNTER — Ambulatory Visit: Payer: Medicare HMO | Admitting: Cardiology

## 2022-01-11 VITALS — BP 130/76 | HR 71 | Temp 98.0°F | Resp 16 | Ht 65.0 in | Wt 265.0 lb

## 2022-01-11 DIAGNOSIS — I5032 Chronic diastolic (congestive) heart failure: Secondary | ICD-10-CM

## 2022-01-11 DIAGNOSIS — E782 Mixed hyperlipidemia: Secondary | ICD-10-CM

## 2022-01-11 DIAGNOSIS — I10 Essential (primary) hypertension: Secondary | ICD-10-CM

## 2022-01-11 MED ORDER — ROSUVASTATIN CALCIUM 10 MG PO TABS: 10.0000 mg | ORAL_TABLET | Freq: Every day | ORAL | 3 refills | Status: DC

## 2022-01-11 NOTE — Progress Notes (Signed)
Subjective:   Christine Munoz, female    DOB: 1941-02-16, 81 y.o.   MRN: [id number]   Chief complaint:  Hypertension   81 year old African-American female with hypertension, morbid obesity, Heart failure with preserved ejection fraction.  Patient is doing well. BP is better controled after adding amlodipine. There is only occasional mild leg edema. Reviewed recent test results with the patient, details below.     Current Outpatient Medications:    Acetaminophen (TYLENOL EXTRA STRENGTH PO), Take by mouth., Disp: , Rfl:    amLODipine (NORVASC) 2.5 MG tablet, Take 1 tablet (2.5 mg total) by mouth daily., Disp: 90 tablet, Rfl: 3   amLODipine (NORVASC) 5 MG tablet, TAKE 1 TABLET BY MOUTH EVERY DAY, Disp: 90 tablet, Rfl: 1   Black Pepper-Turmeric (TURMERIC CURCUMIN) 08-998 MG CAPS, See admin instructions., Disp: , Rfl:    losartan (COZAAR) 100 MG tablet, 100 mg. , Disp: , Rfl: 5   Multiple Vitamins-Minerals (CENTRUM SILVER PO), Take by mouth., Disp: , Rfl:    Omega-3 Fatty Acids (FISH OIL) 1000 MG CAPS, Take by mouth., Disp: , Rfl:    pantoprazole (PROTONIX) 40 MG tablet, Take 1 tablet by mouth daily., Disp: , Rfl:    rosuvastatin (CRESTOR) 10 MG tablet, Take 1 tablet (10 mg total) by mouth daily. (Patient taking differently: Take 5 mg by mouth daily.), Disp: 90 tablet, Rfl: 3   spironolactone (ALDACTONE) 25 MG tablet, TAKE 1 TABLET BY MOUTH EVERY DAY, Disp: 90 tablet, Rfl: 1   Vitamin D, Ergocalciferol, (DRISDOL) 1.25 MG (50000 UT) CAPS capsule, Take 50,000 Units by mouth every 7 (seven) days., Disp: , Rfl:     Cardiovascular studies:  EKG 12/06/2021: Sinus rhythm 67 bpm with rate variation    Echocardiogram 03/05/2019: Left ventricle cavity is normal in size. Mild concentric hypertrophy of the left ventricle. Normal LV systolic function with EF 55%. Normal global wall motion. Doppler evidence of grade I (impaired) diastolic dysfunction, normal LAP.  Mild tricuspid  regurgitation. Estimated pulmonary artery systolic pressure is 30 mmHg.  Compared to previous study on 06/11/2017, LV filling pressures are pulmonary artery pressure are lower.   Lexiscan myoview stress test 06/07/2017:  1. Pharmacologic stress testing was performed with intravenous administration of .4 mg of Lexiscan over a 10-15 seconds infusion. Patient was hypertensive throughout the study.  Stress symptoms included dyspnea, flushing and headache. 2.  Exercise capacity not assessed. Stress EKG is non diagnostic for ischemia as it is a pharmacologic stress.  3. The overall quality of the study is excellent. There is no evidence of abnormal lung activity. Stress and rest SPECT images demonstrate homogeneous tracer distribution throughout the myocardium. Gated SPECT imaging reveals normal myocardial thickening and wall motion. The left ventricular ejection fraction was normal (52%).   4. This is a low risk study.  Pulmonary function test 05/20/2017: Diffusion defect consistent with pulmonary vascular process.  Normal FVC, FEV1, FEV1/FVC ratio and FEF 25-75%. FRC reduced  Recent labs:  12/19/2021: Chol 163, TG 80, HDL 49, LDL 99  09/09/2020: Glucose 110, BUN/Cr 14/0.76. EGFR 81. Na/K 139/4.6. Rest of the CMP normal H/H 13/44. MCV 90. Platelets 221 HbA1C 6.1% Chol 137, TG 58, HDL 51, LDL 74  02/22/2020: Glucose 122, BUN/Cr 17/0.8. EGFR 85. HbA1C N/A% Chol N/A, TG 95, HDL 43, LDL 67 TSH 3.0 normal  08/12/2019: eGFR 64 Chol ?, TG 74, HDL ?, LDL 153.   08/15/2018: eGFR 64 H/H 13/43. MCV 87. Platelets 188.  Chol 226, TG 75,  HDL 65, LDL 146.  HbA1C 6.4% TSH 3.0   09/27/2017: Glucose 122. BUN/Cr 17/0.81. eGFR 82. Na/K 139/4.7  Labs 05/29/2017: H/H 13/40. MCV 89. Platelets 260 Glucose 107. BUN/creatinine 15/0.7. EGFR 89/108 Cholesterol 222, triglycerides 59, HDL 70, LDL 140.  Labs 05/13/2017: H/H 13/40. MCV 89. Platelets 216 Glucose 99. BUN/Cr 20/0.7. eGFR 85/103. Na/K 139/4.1 BNP  196  Review of Systems  Constitutional: Negative for weight gain.  Cardiovascular:  Negative for chest pain, dyspnea on exertion (Mild, stable), leg swelling, palpitations and syncope.  Respiratory:  Negative for cough and shortness of breath.   Musculoskeletal:  Positive for joint pain.  All other systems reviewed and are negative.        Vitals:   01/11/22 1254  BP: 130/76  Pulse: 71  Resp: 16  Temp: 98 F (36.7 C)  SpO2: 97%   Filed Weights   01/11/22 1254  Weight: 265 lb (120.2 kg)     Objective:    Physical Exam Vitals and nursing note reviewed.  Constitutional:      General: She is not in acute distress.    Appearance: She is well-developed. She is obese.  Pulmonary:     Effort: Pulmonary effort is normal.  Musculoskeletal:     Right lower leg: Edema (Trace) present.     Left lower leg: Edema (Trace) present.         Assessment & Recommendations:   81 year old African-American female with hypertension, morbid obesity, Heart failure with preserved ejection fraction.  HFpEF: Euvolumic. Continue current hypertension management  Hypertension: Well controlled  Hyperlipidemia: LDL 99 on Crestor 5 mg. Increased to 10 mg daily.  Repeat lipid panel in 3 months.  F/u in 6 months   [name], MD Pager: [phone] Office: [phone]

## 2022-03-06 ENCOUNTER — Other Ambulatory Visit: Payer: Self-pay | Admitting: Family Medicine

## 2022-03-06 DIAGNOSIS — Z1231 Encounter for screening mammogram for malignant neoplasm of breast: Secondary | ICD-10-CM

## 2022-03-09 ENCOUNTER — Ambulatory Visit
Admission: RE | Admit: 2022-03-09 | Discharge: 2022-03-09 | Disposition: A | Discharge status: Home / Self Care | Payer: Medicare HMO | Source: Ambulatory Visit | Attending: Family Medicine | Admitting: Family Medicine

## 2022-03-09 DIAGNOSIS — Z1231 Encounter for screening mammogram for malignant neoplasm of breast: Secondary | ICD-10-CM

## 2022-05-09 ENCOUNTER — Emergency Department (HOSPITAL_COMMUNITY): Payer: Medicare HMO

## 2022-05-09 ENCOUNTER — Emergency Department (HOSPITAL_COMMUNITY)
Admission: EM | Admit: 2022-05-09 | Discharge: 2022-05-09 | Disposition: A | Discharge status: Home / Self Care | Payer: Medicare HMO | Attending: Emergency Medicine | Admitting: Emergency Medicine

## 2022-05-09 ENCOUNTER — Other Ambulatory Visit: Payer: Self-pay

## 2022-05-09 DIAGNOSIS — S99912A Unspecified injury of left ankle, initial encounter: Secondary | ICD-10-CM | POA: Diagnosis present

## 2022-05-09 DIAGNOSIS — Z79899 Other long term (current) drug therapy: Secondary | ICD-10-CM | POA: Insufficient documentation

## 2022-05-09 DIAGNOSIS — X501XXA Overexertion from prolonged static or awkward postures, initial encounter: Secondary | ICD-10-CM | POA: Insufficient documentation

## 2022-05-09 DIAGNOSIS — Y9301 Activity, walking, marching and hiking: Secondary | ICD-10-CM | POA: Insufficient documentation

## 2022-05-09 DIAGNOSIS — I10 Essential (primary) hypertension: Secondary | ICD-10-CM | POA: Diagnosis not present

## 2022-05-09 DIAGNOSIS — S82425A Nondisplaced transverse fracture of shaft of left fibula, initial encounter for closed fracture: Secondary | ICD-10-CM | POA: Diagnosis not present

## 2022-05-09 DIAGNOSIS — S92355A Nondisplaced fracture of fifth metatarsal bone, left foot, initial encounter for closed fracture: Secondary | ICD-10-CM | POA: Diagnosis not present

## 2022-05-09 MED ORDER — ACETAMINOPHEN 500 MG PO TABS: 500.0000 mg | ORAL_TABLET | Freq: Four times a day (QID) | ORAL | 0 refills | Status: DC | PRN

## 2022-05-09 MED ORDER — OXYCODONE-ACETAMINOPHEN 5-325 MG PO TABS
1.0000 | ORAL_TABLET | Freq: Once | ORAL | Status: AC
  Administered 2022-05-09: 1 via ORAL
  Filled 2022-05-09: qty 1

## 2022-05-09 NOTE — ED Provider Notes (Signed)
[facility] Provider Note   CSN: [id number] Arrival date & time: 05/09/22  3532     History No chief complaint on file.   Christine Munoz is a 82 y.o. female with a past medical history of hypertension presenting today with a left ankle injury.  She reports walking down the stairs this morning and believes she was on the final step and accidentally rolling her left ankle.  No numbness or tingling.  Reports that severely painful and she is having difficulty walking on it  HPI     Home Medications Prior to Admission medications   Medication Sig Start Date End Date Taking? Authorizing Provider  Acetaminophen (TYLENOL EXTRA STRENGTH PO) Take by mouth.    [provider]  amLODipine (NORVASC) 2.5 MG tablet Take 1 tablet (2.5 mg total) by mouth daily. 12/06/21 12/01/22  Patwardhan, Reynold Bowen, MD  amLODipine (NORVASC) 5 MG tablet TAKE 1 TABLET BY MOUTH EVERY DAY 04/25/21   Patwardhan, Reynold Bowen, MD  Black Pepper-Turmeric (TURMERIC CURCUMIN) 08-998 MG CAPS See admin instructions.    [provider]  losartan (COZAAR) 100 MG tablet 100 mg.  11/17/16   [provider]  Multiple Vitamins-Minerals (CENTRUM SILVER PO) Take by mouth.    [provider]  Omega-3 Fatty Acids (FISH OIL) 1000 MG CAPS Take by mouth.    [provider]  pantoprazole (PROTONIX) 40 MG tablet Take 1 tablet by mouth daily. 01/18/20   [provider]  rosuvastatin (CRESTOR) 10 MG tablet Take 1 tablet (10 mg total) by mouth daily. 01/11/22   Patwardhan, Reynold Bowen, MD  spironolactone (ALDACTONE) 25 MG tablet TAKE 1 TABLET BY MOUTH EVERY DAY 03/23/21   Patwardhan, Manish J, MD  Vitamin D, Ergocalciferol, (DRISDOL) 1.25 MG (50000 UT) CAPS capsule Take 50,000 Units by mouth every 7 (seven) days.    [provider]      Allergies    Patient has no known allergies.    Review of Systems   Review of Systems  Physical Exam Updated  Vital Signs There were no vitals taken for this visit. Physical Exam Vitals and nursing note reviewed.  Constitutional:      Appearance: Normal appearance.  HENT:     Head: Normocephalic and atraumatic.  Eyes:     General: No scleral icterus.    Conjunctiva/sclera: Conjunctivae normal.  Cardiovascular:     Comments: Strong DP pulse Pulmonary:     Effort: Pulmonary effort is normal. No respiratory distress.  Musculoskeletal:     Comments: Range of motion intact of the left ankle.  The extremity is warm.  Full range of motion of all MTPs.  Tenderness along the lateral malleolus  Skin:    General: Skin is warm and dry.     Findings: No rash.  Neurological:     Mental Status: She is alert.  Psychiatric:        Mood and Affect: Mood normal.     ED Results / Procedures / Treatments   Labs (all labs ordered are listed, but only abnormal results are displayed) Labs Reviewed - No data to display  EKG None  Radiology No results found.  Procedures Procedures   Medications Ordered in ED Medications - No data to display  ED Course/ Medical Decision Making/ A&P Clinical Course as of 05/09/22 1215  Wed May 09, 2022  1053 DG Ankle Complete Left [MR]    Clinical Course User Index [MR] Adra Shepler A, PA-C  Medical Decision Making Amount and/or Complexity of Data Reviewed Radiology: ordered. Decision-making details documented in ED Course.  Risk OTC drugs. Prescription drug management.   82 year old female presenting after missing a step and hurting her left ankle.  She endorses that it was purely mechanical.  Now having difficulty walking on it.  Imaging: X-ray ordered, viewed and interpreted by me.  I agree with radiology that patient appears to have a distal fibular fracture and right fifth metatarsal avulsion fracture.  I do not see any lisfranc injury  Treatment: Given Percocet in triage  Consults: Spoke with Hilbert Odor with  orthopedics who recommends cam boot, crutches and follow-up with Dr. Zachery Dakins   MDM/disposition: 82 year old female rolling her left ankle.  Has a fibular and fifth metatarsal fracture.  Orthopedics was consulted, placed was placed in cam boot and given crutches.  Will follow-up outpatient.  This was discussed with the patient and her son and they are agreeable to the plan.   Final Clinical Impression(s) / ED Diagnoses Final diagnoses:  Nondisplaced transverse fracture of shaft of left fibula, initial encounter for closed fracture  Nondisplaced fracture of fifth metatarsal bone, left foot, initial encounter for closed fracture    Rx / DC Orders ED Discharge Orders          Ordered    acetaminophen (TYLENOL) 500 MG tablet  Every 6 hours PRN        05/09/22 1212           Results and diagnoses were explained to the patient. Return precautions discussed in full. Patient had no additional questions and expressed complete understanding.   This chart was dictated using voice recognition software.  Despite best efforts to proofread,  errors can occur which can change the documentation meaning.    Darliss Ridgel 05/09/22 1218    Regan Lemming, MD 05/09/22 1534

## 2022-05-09 NOTE — ED Triage Notes (Addendum)
Pt. Stated, I missed a step and turned my left ankle. I took 2 Tylenol at 0800

## 2022-05-09 NOTE — ED Notes (Signed)
Dc instructions and scripts reviewed with pt no questions or concerns at this time. Will follow up with ortho this week.

## 2022-05-09 NOTE — Discharge Instructions (Addendum)
You came to the emergency department today with an ankle injury.  Your xray shows a fracture of your fibula and midfoot.  You have been given a walking boot and crutches.  Please wear these at all times when active.  When resting you may take off the brace and ice your injury.    Do not hesitate to return with any worsening symptoms, otherwise please call the orthopedic doctor attached to these discharge papers either today or tomorrow for an appointment.  Your Tylenol is at your pharmacy.

## 2022-05-22 ENCOUNTER — Ambulatory Visit (HOSPITAL_COMMUNITY)
Admission: RE | Admit: 2022-05-22 | Discharge: 2022-05-22 | Disposition: A | Discharge status: Home / Self Care | Payer: Medicare HMO | Source: Ambulatory Visit | Attending: Vascular Surgery | Admitting: Vascular Surgery

## 2022-05-22 ENCOUNTER — Other Ambulatory Visit (HOSPITAL_COMMUNITY): Payer: Self-pay | Admitting: Orthopedic Surgery

## 2022-05-22 DIAGNOSIS — M79605 Pain in left leg: Secondary | ICD-10-CM | POA: Diagnosis present

## 2022-07-09 ENCOUNTER — Ambulatory Visit: Payer: Medicare HMO | Admitting: Cardiology

## 2022-07-11 ENCOUNTER — Encounter: Payer: Self-pay | Admitting: Cardiology

## 2022-07-11 ENCOUNTER — Ambulatory Visit: Payer: Medicare HMO | Admitting: Cardiology

## 2022-07-11 VITALS — BP 128/69 | HR 70 | Resp 16 | Ht 66.0 in | Wt 265.0 lb

## 2022-07-11 DIAGNOSIS — E782 Mixed hyperlipidemia: Secondary | ICD-10-CM

## 2022-07-11 DIAGNOSIS — R0609 Other forms of dyspnea: Secondary | ICD-10-CM

## 2022-07-11 DIAGNOSIS — I10 Essential (primary) hypertension: Secondary | ICD-10-CM

## 2022-07-11 MED ORDER — AMLODIPINE BESYLATE 5 MG PO TABS: 5.0000 mg | ORAL_TABLET | Freq: Every day | ORAL | 3 refills | Status: AC

## 2022-07-11 MED ORDER — ROSUVASTATIN CALCIUM 10 MG PO TABS: 10.0000 mg | ORAL_TABLET | Freq: Every day | ORAL | 3 refills | Status: DC

## 2022-07-11 MED ORDER — AMLODIPINE BESYLATE 2.5 MG PO TABS: 2.5000 mg | ORAL_TABLET | Freq: Every day | ORAL | 3 refills | Status: DC

## 2022-07-11 MED ORDER — SPIRONOLACTONE 25 MG PO TABS: 25.0000 mg | ORAL_TABLET | Freq: Every day | ORAL | 3 refills | Status: DC

## 2022-07-11 NOTE — Progress Notes (Signed)
Subjective:   Christine Munoz, female    DOB: May 10, 1940, 82 y.o.   MRN: [id number]   Chief complaint:  Hypertension   82 year old African-American female with hypertension, morbid obesity, HFpEF  Patient recently had a mechanical injury to her left ankle, underwent surgery. As a result, she has not been able to do much physical activity. Reviewed recent test results with the patient, details below. She has unchanged mild exertional dyspnea.    Current Outpatient Medications:    acetaminophen (TYLENOL) 500 MG tablet, Take 1 tablet (500 mg total) by mouth every 6 (six) hours as needed., Disp: 30 tablet, Rfl: 0   amLODipine (NORVASC) 2.5 MG tablet, Take 1 tablet (2.5 mg total) by mouth daily., Disp: 90 tablet, Rfl: 3   amLODipine (NORVASC) 5 MG tablet, TAKE 1 TABLET BY MOUTH EVERY DAY, Disp: 90 tablet, Rfl: 1   Black Pepper-Turmeric (TURMERIC CURCUMIN) 08-998 MG CAPS, See admin instructions., Disp: , Rfl:    losartan (COZAAR) 100 MG tablet, 100 mg. , Disp: , Rfl: 5   Multiple Vitamins-Minerals (CENTRUM SILVER PO), Take by mouth., Disp: , Rfl:    Omega-3 Fatty Acids (FISH OIL) 1000 MG CAPS, Take by mouth., Disp: , Rfl:    pantoprazole (PROTONIX) 40 MG tablet, Take 1 tablet by mouth daily., Disp: , Rfl:    rosuvastatin (CRESTOR) 10 MG tablet, Take 1 tablet (10 mg total) by mouth daily., Disp: 90 tablet, Rfl: 3   spironolactone (ALDACTONE) 25 MG tablet, TAKE 1 TABLET BY MOUTH EVERY DAY, Disp: 90 tablet, Rfl: 1   Vitamin D, Ergocalciferol, (DRISDOL) 1.25 MG (50000 UT) CAPS capsule, Take 50,000 Units by mouth every 7 (seven) days., Disp: , Rfl:     Cardiovascular studies:  EKG 07/11/2022: Sinus rhythm 69 bpm Normal EKG   Echocardiogram 03/05/2019: Left ventricle cavity is normal in size. Mild concentric hypertrophy of the left ventricle. Normal LV systolic function with EF 55%. Normal global wall motion. Doppler evidence of grade I (impaired) diastolic dysfunction, normal LAP.   Mild tricuspid regurgitation. Estimated pulmonary artery systolic pressure is 30 mmHg.  Compared to previous study on 06/11/2017, LV filling pressures are pulmonary artery pressure are lower.   Lexiscan myoview stress test 06/07/2017:  1. Pharmacologic stress testing was performed with intravenous administration of .4 mg of Lexiscan over a 10-15 seconds infusion. Patient was hypertensive throughout the study.  Stress symptoms included dyspnea, flushing and headache. 2.  Exercise capacity not assessed. Stress EKG is non diagnostic for ischemia as it is a pharmacologic stress.  3. The overall quality of the study is excellent. There is no evidence of abnormal lung activity. Stress and rest SPECT images demonstrate homogeneous tracer distribution throughout the myocardium. Gated SPECT imaging reveals normal myocardial thickening and wall motion. The left ventricular ejection fraction was normal (52%).   4. This is a low risk study.  Pulmonary function test 05/20/2017: Diffusion defect consistent with pulmonary vascular process.  Normal FVC, FEV1, FEV1/FVC ratio and FEF 25-75%. FRC reduced  Recent labs:  03/12/2022: Chol 168, TG 101, HDL 52, LDL 98  12/19/2021: Chol 163, TG 80, HDL 49, LDL 99  09/09/2020: Glucose 110, BUN/Cr 14/0.76. EGFR 81. Na/K 139/4.6. Rest of the CMP normal H/H 13/44. MCV 90. Platelets 221 HbA1C 6.1% Chol 137, TG 58, HDL 51, LDL 74  02/22/2020: Glucose 122, BUN/Cr 17/0.8. EGFR 85. HbA1C N/A% Chol N/A, TG 95, HDL 43, LDL 67 TSH 3.0 normal  08/12/2019: eGFR 64 Chol ?, TG 74, HDL ?,  LDL 153.   08/15/2018: eGFR 64 H/H 13/43. MCV 87. Platelets 188.  Chol 226, TG 75, HDL 65, LDL 146.  HbA1C 6.4% TSH 3.0   09/27/2017: Glucose 122. BUN/Cr 17/0.81. eGFR 82. Na/K 139/4.7  Labs 05/29/2017: H/H 13/40. MCV 89. Platelets 260 Glucose 107. BUN/creatinine 15/0.7. EGFR 89/108 Cholesterol 222, triglycerides 59, HDL 70, LDL 140.  Labs 05/13/2017: H/H 13/40. MCV 89.  Platelets 216 Glucose 99. BUN/Cr 20/0.7. eGFR 85/103. Na/K 139/4.1 BNP 196  Review of Systems  Constitutional: Negative for weight gain.  Cardiovascular:  Positive for dyspnea on exertion (Mild, stable). Negative for chest pain, leg swelling, palpitations and syncope.  Respiratory:  Negative for cough and shortness of breath.   Musculoskeletal:  Positive for joint pain.  All other systems reviewed and are negative.        Vitals:   07/11/22 1451  BP: 128/69  Pulse: 70  Resp: 16  SpO2: 96%   Filed Weights   07/11/22 1451  Weight: 265 lb (120.2 kg)     Objective:    Physical Exam Vitals and nursing note reviewed.  Constitutional:      General: She is not in acute distress.    Appearance: She is well-developed. She is obese.  Pulmonary:     Effort: Pulmonary effort is normal.  Musculoskeletal:     Right lower leg: No edema.     Left lower leg: No edema.         Assessment & Recommendations:   82 year old African-American female with hypertension, morbid obesity, Heart failure with preserved ejection fraction.  HFpEF: Euvolumic. Continue current hypertension management Recent exertional dyspnea likely due to deconditioning. Will reassess in 3 months as she increases her physical activity.  Hypertension: Well controlled  Hyperlipidemia: Chol 168, TG 101, HDL 52, LDL 98 (02/2022) on Crestor 10 mg. Recent inactivity after ankle injury likely contributing. No change made today.  F/u in 6 months   [name], MD Pager: [phone] Office: [phone]

## 2022-08-03 ENCOUNTER — Telehealth: Payer: Self-pay

## 2022-08-03 NOTE — Telephone Encounter (Signed)
Called patient to inform her about the message above.

## 2022-08-03 NOTE — Telephone Encounter (Signed)
Called patient no answer, left a vm

## 2022-08-03 NOTE — Telephone Encounter (Signed)
No absolute contraindication. Okay to take with food, avoid daily use for long time.  Thanks MJP

## 2022-08-03 NOTE — Telephone Encounter (Signed)
Patient couldn't remember if you told her to not to take meloxicam or if it was ok to take with her other medications.

## 2022-10-11 ENCOUNTER — Ambulatory Visit: Payer: Medicare HMO | Admitting: Cardiology

## 2022-10-22 ENCOUNTER — Ambulatory Visit: Payer: Medicare HMO | Admitting: Cardiology

## 2022-10-22 ENCOUNTER — Encounter: Payer: Self-pay | Admitting: Cardiology

## 2022-10-22 VITALS — BP 129/70 | HR 63 | Ht 66.0 in | Wt 265.0 lb

## 2022-10-22 DIAGNOSIS — R0609 Other forms of dyspnea: Secondary | ICD-10-CM

## 2022-10-22 DIAGNOSIS — I10 Essential (primary) hypertension: Secondary | ICD-10-CM

## 2022-10-22 DIAGNOSIS — I5032 Chronic diastolic (congestive) heart failure: Secondary | ICD-10-CM

## 2022-10-22 NOTE — Progress Notes (Signed)
Subjective:   Christine Munoz, female    DOB: 16-Aug-1940, 82 y.o.   MRN: [id number]   Chief complaint:  Hypertension   82 year old African-American female with hypertension, morbid obesity, HFpEF  She continues to have exertional dyspnea with minimal activity. She denies any specific chest pain symptoms. Her activity remains limited due to arthritis.     Current Outpatient Medications:    acetaminophen (TYLENOL) 500 MG tablet, Take 1 tablet (500 mg total) by mouth every 6 (six) hours as needed., Disp: 30 tablet, Rfl: 0   amLODipine (NORVASC) 2.5 MG tablet, Take 1 tablet (2.5 mg total) by mouth daily., Disp: 90 tablet, Rfl: 3   amLODipine (NORVASC) 5 MG tablet, Take 1 tablet (5 mg total) by mouth daily. To be taken with amlodipine 2.5 mg daily for a total of 7.5 mg daily., Disp: 90 tablet, Rfl: 3   Black Pepper-Turmeric (TURMERIC CURCUMIN) 08-998 MG CAPS, See admin instructions., Disp: , Rfl:    losartan (COZAAR) 100 MG tablet, 100 mg. , Disp: , Rfl: 5   Multiple Vitamins-Minerals (CENTRUM SILVER PO), Take by mouth., Disp: , Rfl:    Omega-3 Fatty Acids (FISH OIL) 1000 MG CAPS, Take by mouth., Disp: , Rfl:    pantoprazole (PROTONIX) 40 MG tablet, Take 1 tablet by mouth daily., Disp: , Rfl:    rosuvastatin (CRESTOR) 10 MG tablet, Take 1 tablet (10 mg total) by mouth daily., Disp: 90 tablet, Rfl: 3   spironolactone (ALDACTONE) 25 MG tablet, Take 1 tablet (25 mg total) by mouth daily., Disp: 90 tablet, Rfl: 3   Vitamin D, Ergocalciferol, (DRISDOL) 1.25 MG (50000 UT) CAPS capsule, Take 50,000 Units by mouth every 7 (seven) days., Disp: , Rfl:     Cardiovascular studies:  EKG 07/11/2022: Sinus rhythm 69 bpm Normal EKG   Echocardiogram 03/05/2019: Left ventricle cavity is normal in size. Mild concentric hypertrophy of the left ventricle. Normal LV systolic function with EF 55%. Normal global wall motion. Doppler evidence of grade I (impaired) diastolic dysfunction, normal LAP.   Mild tricuspid regurgitation. Estimated pulmonary artery systolic pressure is 30 mmHg.  Compared to previous study on 06/11/2017, LV filling pressures are pulmonary artery pressure are lower.   Lexiscan myoview stress test 06/07/2017:  1. Pharmacologic stress testing was performed with intravenous administration of .4 mg of Lexiscan over a 10-15 seconds infusion. Patient was hypertensive throughout the study.  Stress symptoms included dyspnea, flushing and headache. 2.  Exercise capacity not assessed. Stress EKG is non diagnostic for ischemia as it is a pharmacologic stress.  3. The overall quality of the study is excellent. There is no evidence of abnormal lung activity. Stress and rest SPECT images demonstrate homogeneous tracer distribution throughout the myocardium. Gated SPECT imaging reveals normal myocardial thickening and wall motion. The left ventricular ejection fraction was normal (52%).   4. This is a low risk study.  Pulmonary function test 05/20/2017: Diffusion defect consistent with pulmonary vascular process.  Normal FVC, FEV1, FEV1/FVC ratio and FEF 25-75%. FRC reduced  Recent labs:  09/12/2022: Chol 160, TG 80, HDL 58, LDL 87  03/12/2022: Chol 168, TG 101, HDL 52, LDL 98  12/19/2021: Chol 163, TG 80, HDL 49, LDL 99  09/09/2020: Glucose 110, BUN/Cr 14/0.76. EGFR 81. Na/K 139/4.6. Rest of the CMP normal H/H 13/44. MCV 90. Platelets 221 HbA1C 6.1% Chol 137, TG 58, HDL 51, LDL 74  02/22/2020: Glucose 122, BUN/Cr 17/0.8. EGFR 85. HbA1C N/A% Chol N/A, TG 95, HDL 43, LDL 67  TSH 3.0 normal  08/12/2019: eGFR 64 Chol ?, TG 74, HDL ?, LDL 153.   08/15/2018: eGFR 64 H/H 13/43. MCV 87. Platelets 188.  Chol 226, TG 75, HDL 65, LDL 146.  HbA1C 6.4% TSH 3.0   09/27/2017: Glucose 122. BUN/Cr 17/0.81. eGFR 82. Na/K 139/4.7  Labs 05/29/2017: H/H 13/40. MCV 89. Platelets 260 Glucose 107. BUN/creatinine 15/0.7. EGFR 89/108 Cholesterol 222, triglycerides 59, HDL 70, LDL  140.  Labs 05/13/2017: H/H 13/40. MCV 89. Platelets 216 Glucose 99. BUN/Cr 20/0.7. eGFR 85/103. Na/K 139/4.1 BNP 196  Review of Systems  Constitutional: Negative for weight gain.  Cardiovascular:  Positive for dyspnea on exertion. Negative for chest pain, leg swelling, palpitations and syncope.  Respiratory:  Negative for cough and shortness of breath.   Musculoskeletal:  Positive for joint pain.  All other systems reviewed and are negative.        Vitals:   10/22/22 1146  BP: 129/70  Pulse: 63  SpO2: 97%   Filed Weights   10/22/22 1146  Weight: 265 lb (120.2 kg)     Objective:    Physical Exam Vitals and nursing note reviewed.  Constitutional:      General: She is not in acute distress.    Appearance: She is well-developed. She is obese.  Pulmonary:     Effort: Pulmonary effort is normal.  Musculoskeletal:     Right lower leg: No edema.     Left lower leg: Edema (Trace) present.         Assessment & Recommendations:   82 year-old African-American female with hypertension, morbid obesity, heart failure with preserved ejection fraction.  HFpEF: Exertional dyspnea persists, frustrating to her. Will check proBNP, echocardiogram, Lexiscan nuclear stress etst to rule put obstructive CAD.   Hypertension: Well controlled  Hyperlipidemia: Continue Crestor 10 mg daily.  F/u in 3 months   Elder Negus, MD Pager: [phone] Office: [phone]

## 2022-10-27 LAB — PRO B NATRIURETIC PEPTIDE: NT-Pro BNP: 103 pg/mL (ref 0–738)

## 2022-10-28 ENCOUNTER — Other Ambulatory Visit: Payer: Self-pay

## 2022-10-28 ENCOUNTER — Encounter (HOSPITAL_COMMUNITY): Payer: Self-pay | Admitting: *Deleted

## 2022-10-28 ENCOUNTER — Emergency Department (HOSPITAL_COMMUNITY)
Admission: EM | Admit: 2022-10-28 | Discharge: 2022-10-28 | Disposition: A | Discharge status: Home / Self Care | Payer: Medicare HMO | Attending: Emergency Medicine | Admitting: Emergency Medicine

## 2022-10-28 ENCOUNTER — Emergency Department (HOSPITAL_COMMUNITY): Payer: Medicare HMO

## 2022-10-28 DIAGNOSIS — I110 Hypertensive heart disease with heart failure: Secondary | ICD-10-CM | POA: Insufficient documentation

## 2022-10-28 DIAGNOSIS — Z79899 Other long term (current) drug therapy: Secondary | ICD-10-CM | POA: Insufficient documentation

## 2022-10-28 DIAGNOSIS — I509 Heart failure, unspecified: Secondary | ICD-10-CM | POA: Insufficient documentation

## 2022-10-28 DIAGNOSIS — M25551 Pain in right hip: Secondary | ICD-10-CM | POA: Diagnosis present

## 2022-10-28 MED ORDER — METHOCARBAMOL 500 MG PO TABS
500.0000 mg | ORAL_TABLET | Freq: Once | ORAL | Status: AC
  Administered 2022-10-28: 500 mg via ORAL
  Filled 2022-10-28: qty 1

## 2022-10-28 MED ORDER — METHOCARBAMOL 500 MG PO TABS: 500.0000 mg | ORAL_TABLET | Freq: Two times a day (BID) | ORAL | 0 refills | Status: DC

## 2022-10-28 MED ORDER — KETOROLAC TROMETHAMINE 30 MG/ML IJ SOLN
30.0000 mg | Freq: Once | INTRAMUSCULAR | Status: AC
  Administered 2022-10-28: 30 mg via INTRAMUSCULAR
  Filled 2022-10-28: qty 1

## 2022-10-28 NOTE — ED Triage Notes (Signed)
The pt has had rt groin pain for 2 days no known injury  heat and tylenol help but the pain comes back

## 2022-10-28 NOTE — ED Provider Notes (Signed)
[place] EMERGENCY DEPARTMENT AT [facility] Provider Note   CSN: [id number] Arrival date & time: 10/28/22  1829     History  Chief Complaint  Patient presents with   Groin Pain    Christine Munoz is a 82 y.o. female.  With history of arthritis, hypertension, hyperlipidemia, CHF who presents to the ED for evaluation of right hip pain.  This began 2 days ago.  It has progressively gotten worse.  It is worse with any ambulation.  She denies any falls or direct trauma.  She has been taking Tylenol with minimal relief.  She has chronic bilateral knee pain due to arthritis.  She denies any weakness or tingling.  She has chronic numbness to the dorsum of the right foot.  No recent changes.  No low back pain or urinary symptoms.  No calf pain or posterior upper leg pain.  She walks with a cane at baseline.  She is still able to do this but states that it hurts.  She denies any fevers or chills.   Groin Pain       Home Medications Prior to Admission medications   Medication Sig Start Date End Date Taking? Authorizing Provider  methocarbamol (ROBAXIN) 500 MG tablet Take 1 tablet (500 mg total) by mouth 2 (two) times daily. 10/28/22  Yes Freman Lapage, Edsel Petrin, PA-C  acetaminophen (TYLENOL) 500 MG tablet Take 500 mg by mouth every 6 (six) hours as needed.    [provider]  amLODipine (NORVASC) 5 MG tablet Take 1 tablet (5 mg total) by mouth daily. To be taken with amlodipine 2.5 mg daily for a total of 7.5 mg daily. 07/11/22   Patwardhan, Anabel Bene, MD  Black Pepper-Turmeric (TURMERIC CURCUMIN) 08-998 MG CAPS See admin instructions.    [provider]  losartan (COZAAR) 100 MG tablet 100 mg.  11/17/16   [provider]  Multiple Vitamins-Minerals (CENTRUM SILVER PO) Take by mouth.    [provider]  Omega-3 Fatty Acids (FISH OIL) 1000 MG CAPS Take by mouth.    [provider]  pantoprazole (PROTONIX) 40 MG tablet Take 1 tablet by mouth  daily. 01/18/20   [provider]  rosuvastatin (CRESTOR) 10 MG tablet Take 1 tablet (10 mg total) by mouth daily. 07/11/22   Patwardhan, Anabel Bene, MD  spironolactone (ALDACTONE) 25 MG tablet Take 1 tablet (25 mg total) by mouth daily. 07/11/22   Patwardhan, Anabel Bene, MD  Vitamin D, Ergocalciferol, (DRISDOL) 1.25 MG (50000 UT) CAPS capsule Take 50,000 Units by mouth every 7 (seven) days.    [provider]      Allergies    Patient has no known allergies.    Review of Systems   Review of Systems  Musculoskeletal:  Positive for arthralgias.  All other systems reviewed and are negative.   Physical Exam Updated Vital Signs BP (!) 144/78   Pulse (!) 55   Temp 97.6 F (36.4 C) (Oral)   Resp 18   Ht 5\' 6"  (1.676 m)   Wt 120.2 kg   SpO2 96%   BMI 42.77 kg/m  Physical Exam Vitals and nursing note reviewed.  Constitutional:      General: She is not in acute distress.    Appearance: Normal appearance. She is normal weight. She is not ill-appearing.  HENT:     Head: Normocephalic and atraumatic.  Pulmonary:     Effort: Pulmonary effort is normal. No respiratory distress.  Abdominal:  General: Abdomen is flat.  Musculoskeletal:        General: Normal range of motion.     Cervical back: Neck supple.     Comments: Ambulatory in the ED.  Pain with logroll test of the right leg.  No calf tenderness.  No lower extremity swelling bilaterally.  DP pulses 3+ bilaterally.  Sensation intact in all digits.  Capillary refill normal.  Full active flexion and extension of the right hip.  Pain with abduction  Skin:    General: Skin is warm and dry.  Neurological:     Mental Status: She is alert and oriented to person, place, and time.  Psychiatric:        Mood and Affect: Mood normal.        Behavior: Behavior normal.     ED Results / Procedures / Treatments   Labs (all labs ordered are listed, but only abnormal results are displayed) Labs Reviewed - No data to  display  EKG None  Radiology DG Hip Unilat W or Wo Pelvis 2-3 Views Right  Result Date: 10/28/2022 CLINICAL DATA:  Atraumatic right groin pain for 2 days. EXAM: DG HIP (WITH OR WITHOUT PELVIS) 2-3V RIGHT COMPARISON:  None Available. FINDINGS: There is no evidence of hip fracture or dislocation. Mild to moderate severity degenerative changes are seen in the form of joint space narrowing and acetabular sclerosis. IMPRESSION: Degenerative changes without evidence of an acute osseous abnormality. Electronically Signed   By: Aram Candela M.D.   On: 10/28/2022 20:48    Procedures Procedures    Medications Ordered in ED Medications  ketorolac (TORADOL) 30 MG/ML injection 30 mg (30 mg Intramuscular Given 10/28/22 2059)  methocarbamol (ROBAXIN) tablet 500 mg (500 mg Oral Given 10/28/22 2058)    ED Course/ Medical Decision Making/ A&P                             Medical Decision Making Amount and/or Complexity of Data Reviewed Radiology: ordered.  This patient presents to the ED for concern of right hip pain, this involves an extensive number of treatment options, and is a complaint that carries with it a high risk of complications and morbidity.  The differential diagnosis includes OA/RA, fracture, strain, sprain, DVT, arterial occlusion/dissection  Co morbidities that complicate the patient evaluation  arthritis, hypertension, hyperlipidemia, CHF  My initial workup includes imaging  Additional history obtained from: Nursing notes from this visit. Family son is at bedside and provides a portion of the history  I ordered imaging studies including x-ray right hip I independently visualized and interpreted imaging which showed moderate degenerative changes of the right hip.  No acute osseous abnormalities. I agree with the radiologist interpretation  Afebrile, hemodynamically stable.  82 year old female presenting to the ED for evaluation of atraumatic right hip pain.  This began 2  days ago.  She has a significant history of arthritis.  She denies any posterior leg pain or calf pain.  No neurologic complaints.  Her neurovascular status is intact.  Her pain is worsened with internal rotation and abduction.  Pain is localized to the anterior of the right hip.  Overall I suspect either an adductor muscle strain versus hip osteoarthritis.  I have low suspicion for DVT given her presentation.  She was given a prescription for Robaxin and educated on potential side effects.  She was given contact information for orthopedics and encouraged to follow-up.  She was given return  precautions.  Stable at discharge.  At this time there does not appear to be any evidence of an acute emergency medical condition and the patient appears stable for discharge with appropriate outpatient follow up. Diagnosis was discussed with patient who verbalizes understanding of care plan and is agreeable to discharge. I have discussed return precautions with patient and son who verbalizes understanding. Patient encouraged to follow-up with orthopedics within one week. All questions answered.  Patient's case discussed with Dr. Durwin Nora who agrees with plan to discharge with follow-up.   Note: Portions of this report may have been transcribed using voice recognition software. Every effort was made to ensure accuracy; however, inadvertent computerized transcription errors may still be present.        Final Clinical Impression(s) / ED Diagnoses Final diagnoses:  Right hip pain    Rx / DC Orders ED Discharge Orders          Ordered    methocarbamol (ROBAXIN) 500 MG tablet  2 times daily        10/28/22 2106              Mora Bellman 10/28/22 2106    Gloris Manchester, MD 10/28/22 2139

## 2022-10-28 NOTE — Discharge Instructions (Addendum)
You have been seen today for your complaint of right hip pain. Your imaging . Your discharge medications include tylenol. Take up to 1000 mg every 6 hours. Robaxin. This is a muscle relaxer. It may cause drowsiness. Do not drive, operate heavy machinery or make important decisions when taking this medication. Only take it at night until you know how it affects you. Only take it as needed and take other medications such as ibuprofen or tylenol prior to trying this medication.   Follow up with: Dr. Charlann Boxer. Call tomorrow to schedule a follow up appointment.  Please seek immediate medical care if you develop any of the following symptoms: You fall. You have a sudden increase in pain and swelling in your hip. Your hip is red or swollen or very tender to touch. At this time there does not appear to be the presence of an emergent medical condition, however there is always the potential for conditions to change. Please read and follow the below instructions.  Do not take your medicine if  develop an itchy rash, swelling in your mouth or lips, or difficulty breathing; call 911 and seek immediate emergency medical attention if this occurs.  You may review your lab tests and imaging results in their entirety on your MyChart account.  Please discuss all results of fully with your primary care provider and other specialist at your follow-up visit.  Note: Portions of this text may have been transcribed using voice recognition software. Every effort was made to ensure accuracy; however, inadvertent computerized transcription errors may still be present.

## 2022-10-28 NOTE — ED Notes (Signed)
Discharge instructions discussed with pt. Verbalized understanding. VSS. No questions or concerns regarding discharge  

## 2022-10-29 ENCOUNTER — Other Ambulatory Visit: Payer: Medicare HMO

## 2022-11-05 ENCOUNTER — Other Ambulatory Visit: Payer: Medicare HMO

## 2022-11-06 ENCOUNTER — Ambulatory Visit: Payer: Medicare HMO

## 2022-11-06 DIAGNOSIS — R0609 Other forms of dyspnea: Secondary | ICD-10-CM

## 2022-11-21 ENCOUNTER — Ambulatory Visit: Payer: Medicare HMO

## 2022-11-21 DIAGNOSIS — R0609 Other forms of dyspnea: Secondary | ICD-10-CM

## 2023-01-30 ENCOUNTER — Ambulatory Visit: Payer: Medicare HMO | Admitting: Cardiology

## 2023-02-08 ENCOUNTER — Ambulatory Visit (HOSPITAL_BASED_OUTPATIENT_CLINIC_OR_DEPARTMENT_OTHER): Payer: Medicare HMO | Admitting: Obstetrics & Gynecology

## 2023-02-13 ENCOUNTER — Other Ambulatory Visit: Payer: Self-pay | Admitting: Family Medicine

## 2023-02-13 DIAGNOSIS — Z1231 Encounter for screening mammogram for malignant neoplasm of breast: Secondary | ICD-10-CM

## 2023-02-21 ENCOUNTER — Ambulatory Visit: Payer: Medicare HMO | Attending: Cardiology | Admitting: Cardiology

## 2023-02-21 ENCOUNTER — Encounter: Payer: Self-pay | Admitting: Cardiology

## 2023-02-21 VITALS — BP 112/76 | HR 56 | Resp 16 | Ht 66.0 in | Wt 266.0 lb

## 2023-02-21 DIAGNOSIS — I10 Essential (primary) hypertension: Secondary | ICD-10-CM

## 2023-02-21 DIAGNOSIS — E782 Mixed hyperlipidemia: Secondary | ICD-10-CM

## 2023-02-21 NOTE — Patient Instructions (Signed)
 Medication Instructions:   Your physician recommends that you continue on your current medications as directed. Please refer to the Current Medication list given to you today.  *If you need a refill on your cardiac medications before your next appointment, please call your pharmacy*    Follow-Up: At [facility], you and your health needs are our priority.  As part of our continuing mission to provide you with exceptional heart care, we have created designated Provider Care Teams.  These Care Teams include your primary Cardiologist (physician) and Advanced Practice Providers (APPs -  Physician Assistants and Nurse Practitioners) who all work together to provide you with the care you need, when you need it.  We recommend signing up for the patient portal called "MyChart".  Sign up information is provided on this After Visit Summary.  MyChart is used to connect with patients for Virtual Visits (Telemedicine).  Patients are able to view lab/test results, encounter notes, upcoming appointments, etc.  Non-urgent messages can be sent to your provider as well.   To learn more about what you can do with MyChart, go to [email or web address].    Your next appointment:   1 year(s)  Provider:   DR. Rosemary Holms

## 2023-02-21 NOTE — Progress Notes (Signed)
  Cardiology Office Note:  .   Date:  02/21/2023  ID:  Christine Munoz, DOB December 27, 1940, MRN [id number] PCP: Irena Reichmann, DO  [facility] Providers Cardiologist:  Truett Mainland, MD PCP: Irena Reichmann, DO  Chief Complaint  Patient presents with   Chronic heart failure with preserved ejection fraction    Follow-up    3 month      History of Present Illness: Christine Munoz is a 82 y.o. female with hypertension, morbid obesity, HFpEF   Patient is doing well.  Does not have any significant complaints of dyspnea, orthopnea, PND, leg edema.  Blood pressure is well-controlled.  Reviewed recent lipid results with the patient, details below.  Vitals:   02/21/23 0940  BP: 112/76  Pulse: (!) 56  Resp: 16     ROS:  Review of Systems  Cardiovascular:  Negative for chest pain, dyspnea on exertion, leg swelling, palpitations and syncope.     Studies Reviewed: Marland Kitchen       Exercise nuclear stress test 11/06/2022: Myocardial perfusion is normal. Overall LV systolic function is normal without regional wall motion abnormalities. Stress LV EF: 64%.  Normal ECG stress. The patient exercised for 2 minutes and 19 seconds of a Bruce protocol, achieving approximately 4.64 METs & 99% MPHR. No chest pain. Markedly reduced exercise tolerance. Normal BP response. The heart rate response was accelerated.  No previous exam available for comparison. Low risk.   Labs 10/26/2022: ProBNP 103 normal     Physical Exam:   Physical Exam Vitals and nursing note reviewed.  Constitutional:      General: She is not in acute distress. Neck:     Vascular: No JVD.  Cardiovascular:     Rate and Rhythm: Normal rate and regular rhythm.     Heart sounds: Normal heart sounds. No murmur heard. Pulmonary:     Effort: Pulmonary effort is normal.     Breath sounds: Normal breath sounds. No wheezing or rales.  Musculoskeletal:     Right lower leg: No edema.     Left lower leg: No edema.       VISIT DIAGNOSES:   ICD-10-CM   1. Essential hypertension  I10     2. Mixed hyperlipidemia  E78.2        ASSESSMENT AND PLAN: .    Christine Munoz is a 82 y.o. female with hypertension, morbid obesity, heart failure with preserved ejection fraction.   Hypertension: Well controlled No obvious evidence of heart failure at this time.  Hyperlipidemia: Fairly well-controlled. Continue Crestor 10 mg daily.    F/u in 1 year  Signed, Elder Negus, MD

## 2023-03-11 ENCOUNTER — Ambulatory Visit: Payer: Medicare HMO

## 2023-03-13 ENCOUNTER — Encounter (HOSPITAL_BASED_OUTPATIENT_CLINIC_OR_DEPARTMENT_OTHER): Payer: Self-pay | Admitting: Obstetrics & Gynecology

## 2023-03-13 ENCOUNTER — Ambulatory Visit (HOSPITAL_BASED_OUTPATIENT_CLINIC_OR_DEPARTMENT_OTHER): Payer: Medicare HMO | Admitting: Obstetrics & Gynecology

## 2023-03-13 VITALS — BP 120/68 | HR 75 | Ht 65.0 in | Wt 266.2 lb

## 2023-03-13 DIAGNOSIS — Z01419 Encounter for gynecological examination (general) (routine) without abnormal findings: Secondary | ICD-10-CM

## 2023-03-13 DIAGNOSIS — I10 Essential (primary) hypertension: Secondary | ICD-10-CM

## 2023-03-13 DIAGNOSIS — Z9889 Other specified postprocedural states: Secondary | ICD-10-CM

## 2023-03-13 NOTE — Progress Notes (Signed)
82 y.o. G1P1 Single Black or [place] American female here for breast and pelvic exam.  I am also following her for h/o fibroids.  Denies vaginal bleeding since I saw her last.  She is having some urinary leakage.  She wears a light pad.    No LMP recorded. Patient is postmenopausal.          Sexually active: No.  H/O STD:  no  Health Maintenance: PCP:  Artist Pais.  Last wellness is scheduled for next week.     Vaccines are up to date:  yes Colonoscopy:  07/07/2019 MMG:  03/09/2022 Negative, scheduled for 04/09/2023 BMD:  at PCP office Last pap smear:  02/04/2017 Negative.   H/o abnormal pap smear:  no    reports that she quit smoking about 21 years ago. Her smoking use included cigarettes. She started smoking about 36 years ago. She has a 3.8 pack-year smoking history. She has never used smokeless tobacco. She reports current alcohol use. She reports that she does not use drugs.  Past Medical History:  Diagnosis Date   Anemia    h/o anemia   Arthritis    GERD (gastroesophageal reflux disease)    no meds   Hyperlipidemia    borderline   Hypertension    Leg abrasion    had to go to the wound center-? diag   Shortness of breath    due to her weight- per pt    Past Surgical History:  Procedure Laterality Date   BIOPSY BREAST Left    CATARACT EXTRACTION     11/16/14 and 12/13/14 (Dr. Elmer Picker)   DILATATION & CURRETTAGE/HYSTEROSCOPY WITH RESECTOCOPE N/A 12/07/2013   Procedure: DILATATION & CURETTAGE/HYSTEROSCOPY WITH RESECTOCOPE;  Surgeon: Annamaria Boots, MD;  Location: [facility];  Service: Gynecology;  Laterality: N/A;   FOOT SURGERY  1/16   hamer toe repair   ROTATOR CUFF REPAIR Right 05/24/2015   TONSILLECTOMY     TUBAL LIGATION      Current Outpatient Medications  Medication Sig Dispense Refill   acetaminophen (TYLENOL) 500 MG tablet Take 500 mg by mouth every 6 (six) hours as needed.     amLODipine (NORVASC) 5 MG tablet Take 1 tablet (5 mg total) by mouth daily. To  be taken with amlodipine 2.5 mg daily for a total of 7.5 mg daily. 90 tablet 3   Black Pepper-Turmeric (TURMERIC CURCUMIN) 08-998 MG CAPS See admin instructions.     losartan (COZAAR) 100 MG tablet 100 mg.   5   Multiple Vitamins-Minerals (CENTRUM SILVER PO) Take by mouth.     Omega-3 Fatty Acids (FISH OIL) 1000 MG CAPS Take by mouth.     pantoprazole (PROTONIX) 40 MG tablet Take 1 tablet by mouth daily.     rosuvastatin (CRESTOR) 10 MG tablet Take 1 tablet (10 mg total) by mouth daily. 90 tablet 3   spironolactone (ALDACTONE) 25 MG tablet Take 1 tablet (25 mg total) by mouth daily. 90 tablet 3   Vitamin D, Ergocalciferol, (DRISDOL) 1.25 MG (50000 UT) CAPS capsule Take 50,000 Units by mouth every 7 (seven) days.     No current facility-administered medications for this visit.    Family History  Problem Relation Age of Onset   Liver cancer Sister    Brain cancer Father        tumor   Arthritis Mother    Colon cancer Maternal Uncle    Leukemia Maternal Uncle     Review of Systems  Constitutional: Negative.  Genitourinary: Negative.     Exam:   BP 120/68 (BP Location: Right Arm, Patient Position: Sitting, Cuff Size: Large)   Pulse 75   Ht 5\' 5"  (1.651 m)   Wt 266 lb 3.2 oz (120.7 kg)   BMI 44.30 kg/m   Height: 5\' 5"  (165.1 cm)  General appearance: alert, cooperative and appears stated age Breasts: normal appearance, no masses or tenderness Abdomen: soft, non-tender; bowel sounds normal; no masses,  no organomegaly Lymph nodes: Cervical, supraclavicular, and axillary nodes normal.  No abnormal inguinal nodes palpated Neurologic: Grossly normal  Pelvic: External genitalia:  no lesions              Urethra:  normal appearing urethra with no masses, tenderness or lesions              Bartholins and Skenes: normal                 Vagina: normal appearing vagina with atrophic changes and no discharge, no lesions              Cervix: no lesions              Pap taken:  No. Bimanual Exam:  Uterus:  normal              Adnexa: no mass, fullness, tenderness               Rectovaginal: Confirms               Anus:  normal sphincter tone, no lesions  Chaperone, Ina Homes, CMA, was present for exam.  Assessment/Plan: 1. Encntr for gyn exam (general) (routine) w/o abn findings - Pap smear 02/04/2017.  Not needed due to benign hx and age. - Mammogram 03/09/2022, scheduled for 04/09/2023 - Colonoscopy 07/07/2019.  No routine screening follow up recommended. - Bone mineral density done with PCP - lab work done with Irena Reichmann and her cardiology - vaccines reviewed/updated  2. Essential hypertension - followed by Dr Rosemary Holms, cardiology  3. History of hysteroscopy - s/p polypectomy in 2015.  No recent bleeding.

## 2023-04-09 ENCOUNTER — Ambulatory Visit
Admission: RE | Admit: 2023-04-09 | Discharge: 2023-04-09 | Disposition: A | Discharge status: Home / Self Care | Payer: Medicare HMO | Source: Ambulatory Visit | Attending: Family Medicine | Admitting: Family Medicine

## 2023-04-09 DIAGNOSIS — Z1231 Encounter for screening mammogram for malignant neoplasm of breast: Secondary | ICD-10-CM

## 2023-10-10 ENCOUNTER — Other Ambulatory Visit: Payer: Self-pay | Admitting: Cardiology

## 2023-10-10 DIAGNOSIS — E782 Mixed hyperlipidemia: Secondary | ICD-10-CM

## 2023-12-19 ENCOUNTER — Other Ambulatory Visit: Payer: Self-pay

## 2023-12-19 ENCOUNTER — Ambulatory Visit

## 2023-12-19 VITALS — BP 106/66 | HR 80 | Temp 98.7°F | Ht 66.0 in | Wt 272.0 lb

## 2023-12-19 DIAGNOSIS — Z6841 Body Mass Index (BMI) 40.0 and over, adult: Secondary | ICD-10-CM | POA: Diagnosis not present

## 2023-12-19 DIAGNOSIS — M7989 Other specified soft tissue disorders: Secondary | ICD-10-CM | POA: Diagnosis not present

## 2023-12-19 DIAGNOSIS — R0609 Other forms of dyspnea: Secondary | ICD-10-CM

## 2023-12-19 DIAGNOSIS — J4530 Mild persistent asthma, uncomplicated: Secondary | ICD-10-CM

## 2023-12-19 MED ORDER — BUDESONIDE-FORMOTEROL FUMARATE 160-4.5 MCG/ACT IN AERO: 2.0000 | INHALATION_SPRAY | Freq: Two times a day (BID) | RESPIRATORY_TRACT | 3 refills | Status: DC

## 2023-12-19 NOTE — Progress Notes (Signed)
 New Patient Pulmonology Office Visit   Subjective:  Patient ID: Christine Munoz, female    DOB: 04/27/1940  MRN: [id number]  Referred by: Gerome Brunet, DO  CC:  Chief Complaint  Patient presents with   Consult   Shortness of Breath    HPI Christine Munoz is a 83 y.o. female with hypertension, morbid obesity, tonsillectomy.  DOE since last 1 year. A/w wheezing. Occasional cough no phlegm. No nocturnal cough. No chest tightness. No F/C/N/V. No sick contact.   ASTHMA:  First diagnosed: no formal diagnosis.  FH: sister Triggers: heavy perfumes which caused sob.  Intubated: no Last steroid use:no Treatment: not on inhaler.  Others: allergy symptoms- no significant allergy symptoms, throat choking/stridor- no, GERD- no, OSA- no   Lung Health: Functional status: ET can do grocery in [place] which does not bother her breathing much.  Covid vaccine: 5 so far.  Pneumonococcal vaccine: through PCP.  Smoking: ex 3cig/d intermittently and quit in 2003.  Occupational exposure/pets: no pets. Used to have a dog. No birds. 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   Labs: BNP 103 in 2024,  Echo July 2024: Normal EF 60-65%.  Normal diastolic filling pressures.  Mild TR no evidence of pulmonary hypertension.  In 2020 had grade 1 diastolic dysfunction. PFT: 2019: No obstructive or restrictive lung disease but moderate reduction in DLCO.  Corrected DLCO not available.   Allergies: Patient has no known allergies.  Current Outpatient Medications:    acetaminophen  (TYLENOL ) 500 MG tablet, Take 500 mg by mouth every 6 (six) hours as needed., Disp: , Rfl:    amLODipine  (NORVASC ) 5 MG tablet, Take 1 tablet (5 mg total) by mouth daily. To be taken with amlodipine  2.5 mg daily for a total of 7.5 mg daily., Disp: 90 tablet, Rfl: 3   Black Pepper-Turmeric (TURMERIC CURCUMIN) 08-998 MG CAPS, See admin instructions., Disp: , Rfl:    budesonide -formoterol  (SYMBICORT ) 160-4.5 MCG/ACT inhaler,  Inhale 2 puffs into the lungs 2 (two) times daily., Disp: 1 each, Rfl: 3   losartan (COZAAR) 100 MG tablet, 100 mg. , Disp: , Rfl: 5   Multiple Vitamins-Minerals (CENTRUM SILVER  PO), Take by mouth., Disp: , Rfl:    Omega-3 Fatty Acids (FISH OIL) 1000 MG CAPS, Take by mouth., Disp: , Rfl:    pantoprazole (PROTONIX) 40 MG tablet, Take 1 tablet by mouth daily., Disp: , Rfl:    rosuvastatin  (CRESTOR ) 10 MG tablet, TAKE 1 TABLET BY MOUTH EVERY DAY, Disp: 90 tablet, Rfl: 0   spironolactone  (ALDACTONE ) 25 MG tablet, Take 1 tablet (25 mg total) by mouth daily., Disp: 90 tablet, Rfl: 3   Vitamin D, Ergocalciferol, (DRISDOL) 1.25 MG (50000 UT) CAPS capsule, Take 50,000 Units by mouth every 7 (seven) days., Disp: , Rfl:  Past Medical History:  Diagnosis Date   Anemia    h/o anemia   Arthritis    GERD (gastroesophageal reflux disease)    no meds   Hyperlipidemia    borderline   Hypertension    Leg abrasion    had to go to the wound center-? diag   Shortness of breath    due to her weight- per pt   Past Surgical History:  Procedure Laterality Date   BIOPSY BREAST Left    CATARACT EXTRACTION     11/16/14 and 12/13/14 (Dr. Cleatus)   DILATATION & CURRETTAGE/HYSTEROSCOPY WITH RESECTOCOPE N/A 12/07/2013   Procedure: DILATATION & CURETTAGE/HYSTEROSCOPY WITH RESECTOCOPE;  Surgeon: Ronal Elvie Pinal, MD;  Location: [facility];  Service: Gynecology;  Laterality: N/A;   FOOT SURGERY  1/16   hamer toe repair   ROTATOR CUFF REPAIR Right 05/24/2015   TONSILLECTOMY     TUBAL LIGATION     Family History  Problem Relation Age of Onset   Liver cancer Sister    Brain cancer Father        tumor   Arthritis Mother    Colon cancer Maternal Uncle    Leukemia Maternal Uncle    Social History   Socioeconomic History   Marital status: Single    Spouse name: Not on file   Number of children: 1   Years of education: Not on file   Highest education level: Not on file  Occupational History   Not on file   Tobacco Use   Smoking status: Former    Current packs/day: 0.00    Average packs/day: 0.3 packs/day for 15.0 years (3.8 ttl pk-yrs)    Types: Cigarettes    Start date: 8    Quit date: 2003    Years since quitting: 22.6   Smokeless tobacco: Never   Tobacco comments:    years ago-not heavy/quit 2003/2004  Vaping Use   Vaping status: Never Used  Substance and Sexual Activity   Alcohol use: Yes    Alcohol/week: 0.0 - 1.0 standard drinks of alcohol    Comment: occ   Drug use: No   Sexual activity: Not Currently    Partners: Male    Birth control/protection: Post-menopausal  Other Topics Concern   Not on file  Social History Narrative   Not on file   Social Drivers of Health   Financial Resource Strain: Not on file  Food Insecurity: Not on file  Transportation Needs: Not on file  Physical Activity: Not on file  Stress: Not on file  Social Connections: Not on file  Intimate Partner Violence: Not on file       Objective:  BP 106/66   Pulse 80   Temp 98.7 F (37.1 C) (Oral)   Ht 5' 6 (1.676 m)   Wt 272 lb (123.4 kg)   SpO2 94% Comment: RA  BMI 43.90 kg/m  BMI Readings from Last 3 Encounters:  12/19/23 43.90 kg/m  03/13/23 44.30 kg/m  02/21/23 42.93 kg/m    General: Morbidly obese lady in no respiratory distress. Lungs: clear to auscultation bilaterally.  Heart: regular rate rhythm, no murmur appreciated.  Abdomen: non tender, non distended. Normal BS.  Neuro: axox 3.  Moving all 4 extremities.   Diagnostic Review:  Last metabolic panel No results found for: GLUCOSE, NA, K, CL, CO2, BUN, CREATININE, EGFR, CALCIUM , PHOS, PROT, ALBUMIN, LABGLOB, AGRATIO, BILITOT, ALKPHOS, AST, ALT, ANIONGAP     Assessment & Plan:   Assessment & Plan Dyspnea on exertion Likely related to her asthma.  Obesity is contributing. Getting labs and workup as below. Mild persistent asthma, unspecified whether complicated Likely has  asthma based on her symptoms.  Will treat with Symbicort  160. Monitor symptoms after Symbicort .  If improved will continue. Leg swelling BNP ordered.  If elevated will consider diuretic. Morbid obesity due to excess calories (HCC) Weight loss will help with symptoms.  Orders Placed This Encounter  Procedures   DG Chest 2 View   B Nat Peptide   Pulmonary function test      Return in 8 weeks (on 02/13/2024) for sch after PFTs.   Ramses Klecka, MD

## 2023-12-19 NOTE — Patient Instructions (Signed)
 Your shortness of breath possibly is from mild asthma.  Will start you on an inhaler.  Take as indicated.  Gargle mouth after use.  In addition we will get chest x-ray and lung function test to evaluate for your breathing.

## 2023-12-20 LAB — BRAIN NATRIURETIC PEPTIDE: Pro B Natriuretic peptide (BNP): 31 pg/mL (ref 0.0–100.0)

## 2023-12-25 ENCOUNTER — Ambulatory Visit: Payer: Self-pay

## 2023-12-25 ENCOUNTER — Other Ambulatory Visit: Payer: Self-pay

## 2023-12-25 ENCOUNTER — Telehealth: Payer: Self-pay

## 2023-12-25 ENCOUNTER — Other Ambulatory Visit (HOSPITAL_COMMUNITY): Payer: Self-pay

## 2023-12-25 MED ORDER — BUDESONIDE-FORMOTEROL FUMARATE 160-4.5 MCG/ACT IN AERO: 2.0000 | INHALATION_SPRAY | Freq: Two times a day (BID) | RESPIRATORY_TRACT | 11 refills | Status: DC

## 2023-12-25 NOTE — Telephone Encounter (Signed)
 Thank you :)

## 2023-12-25 NOTE — Telephone Encounter (Signed)
*  Pulm  Pharmacy Patient Advocate Encounter   Received notification from RX Request Messages that prior authorization for budesonide -formoterol  (SYMBICORT ) 160-4.5 MCG/ACT inhaler  is required/requested.   Insurance verification completed.   The patient is insured through [facility] .   Per test claim:  [name]  10.3gm is preferred by the insurance.  If suggested medication is appropriate, Please send in a new RX and discontinue this one. If not, please advise as to why it's not appropriate so that we may request a Prior Authorization. Please note, some preferred medications may still require a PA.  If the suggested medications have not been trialed and there are no contraindications to their use, the PA will not be submitted, as it will not be approved.   [name] - $42.00

## 2024-01-15 ENCOUNTER — Other Ambulatory Visit: Payer: Self-pay | Admitting: Cardiology

## 2024-01-15 DIAGNOSIS — E782 Mixed hyperlipidemia: Secondary | ICD-10-CM

## 2024-01-15 MED ORDER — ROSUVASTATIN CALCIUM 10 MG PO TABS: 10.0000 mg | ORAL_TABLET | Freq: Every day | ORAL | 0 refills | Status: DC

## 2024-02-05 ENCOUNTER — Other Ambulatory Visit: Payer: Self-pay

## 2024-02-05 DIAGNOSIS — R0609 Other forms of dyspnea: Secondary | ICD-10-CM

## 2024-02-07 MED ORDER — SPIRONOLACTONE 25 MG PO TABS: 25.0000 mg | ORAL_TABLET | Freq: Every day | ORAL | 0 refills | Status: DC

## 2024-02-14 ENCOUNTER — Ambulatory Visit

## 2024-02-14 ENCOUNTER — Ambulatory Visit: Payer: Self-pay

## 2024-02-14 VITALS — BP 122/74 | HR 65 | Temp 97.8°F | Ht 65.0 in | Wt 271.8 lb

## 2024-02-14 DIAGNOSIS — Z6841 Body Mass Index (BMI) 40.0 and over, adult: Secondary | ICD-10-CM

## 2024-02-14 DIAGNOSIS — J4530 Mild persistent asthma, uncomplicated: Secondary | ICD-10-CM

## 2024-02-14 DIAGNOSIS — R0609 Other forms of dyspnea: Secondary | ICD-10-CM

## 2024-02-14 LAB — PULMONARY FUNCTION TEST
DL/VA % pred: 96 %
DL/VA: 3.88 ml/min/mmHg/L
DLCO unc % pred: 68 %
DLCO unc: 13.28 ml/min/mmHg
FEF 25-75 Post: 3.14 L/s
FEF 25-75 Pre: 2.03 L/s
FEF2575-%Change-Post: 54 %
FEF2575-%Pred-Post: 221 %
FEF2575-%Pred-Pre: 143 %
FEV1-%Change-Post: 10 %
FEV1-%Pred-Post: 87 %
FEV1-%Pred-Pre: 79 %
FEV1-Post: 1.76 L
FEV1-Pre: 1.6 L
FEV1FVC-%Change-Post: 6 %
FEV1FVC-%Pred-Pre: 113 %
FEV6-%Change-Post: 3 %
FEV6-%Pred-Post: 78 %
FEV6-%Pred-Pre: 75 %
FEV6-Post: 1.99 L
FEV6-Pre: 1.91 L
FEV6FVC-%Pred-Post: 105 %
FEV6FVC-%Pred-Pre: 105 %
FVC-%Change-Post: 3 %
FVC-%Pred-Post: 73 %
FVC-%Pred-Pre: 71 %
FVC-Post: 1.99 L
FVC-Pre: 1.91 L
Post FEV1/FVC ratio: 89 %
Post FEV6/FVC ratio: 100 %
Pre FEV1/FVC ratio: 83 %
Pre FEV6/FVC Ratio: 100 %
RV % pred: 92 %
RV: 2.28 L
TLC % pred: 85 %
TLC: 4.46 L

## 2024-02-14 LAB — CBC WITH DIFFERENTIAL/PLATELET
Basophils Absolute: 0 K/uL (ref 0.0–0.1)
Basophils Relative: 0.4 % (ref 0.0–3.0)
Eosinophils Absolute: 0.1 K/uL (ref 0.0–0.7)
Eosinophils Relative: 1.3 % (ref 0.0–5.0)
HCT: 41.8 % (ref 36.0–46.0)
Hemoglobin: 13.5 g/dL (ref 12.0–15.0)
Lymphocytes Relative: 21.6 % (ref 12.0–46.0)
Lymphs Abs: 2.1 K/uL (ref 0.7–4.0)
MCHC: 32.4 g/dL (ref 30.0–36.0)
MCV: 88.6 fl (ref 78.0–100.0)
Monocytes Absolute: 0.8 K/uL (ref 0.1–1.0)
Monocytes Relative: 7.8 % (ref 3.0–12.0)
Neutro Abs: 6.9 K/uL (ref 1.4–7.7)
Neutrophils Relative %: 68.9 % (ref 43.0–77.0)
Platelets: 225 K/uL (ref 150.0–400.0)
RBC: 4.72 Mil/uL (ref 3.87–5.11)
RDW: 14.2 % (ref 11.5–15.5)
WBC: 10 K/uL (ref 4.0–10.5)

## 2024-02-14 MED ORDER — BUDESONIDE-FORMOTEROL FUMARATE 160-4.5 MCG/ACT IN AERO: 2.0000 | INHALATION_SPRAY | Freq: Two times a day (BID) | RESPIRATORY_TRACT | 11 refills | Status: AC

## 2024-02-14 NOTE — Progress Notes (Signed)
 New Patient Pulmonology Office Visit   Subjective:  Patient ID: Christine Munoz, female    DOB: [birth year]  MRN: [id number]  Referred by: Gerome Brunet, DO  CC:  Chief Complaint  Patient presents with   Shortness of Breath    PFT F/U Pt stated since LOV breathing has been okay  SOB occurring w/ exertion Dry cough occurs occasionally  Pt stated [name]  was never available for her to pick up at pharmacy     HPI Christine Munoz is a 83 y.o. female with hypertension, morbid obesity, tonsillectomy.  DOE since last 1 year. A/w wheezing. Occasional cough no phlegm. No nocturnal cough. No chest tightness. No F/C/N/V. No sick contact.   ASTHMA:  First diagnosed: no formal diagnosis.  FH: sister Triggers: heavy perfumes which caused sob.  Intubated: no Last steroid use:no Treatment: not on inhaler.  Others: allergy symptoms- no significant allergy symptoms, throat choking/stridor- no, GERD- no, OSA- no   Discussed the use of AI scribe software for clinical note transcription with the patient, who gave verbal consent to proceed.  History of Present Illness   Christine Munoz is an 83 year old female who presents with issues related to inhaler prescription and lung function.  She did not receive the inhaler prescription at her pharmacy, despite it being ordered on December 25, 2023. She has been unable to obtain the inhaler, which was supposed to be [name] , as her insurance did not approve Symbicort , the usual prescription. She has not been using any inhaler since the prescription issue.  Her lung function test results from a recent evaluation were similar to those from 2019. However, her lung function improved significantly after using inhalers during the test, and she feels better after using the inhalers provided during the test. Her diffusion capacity (DLCO) remains low, as it was in 2019. She did not receive any messages about her chest x-ray findings due to issues  with accessing MyChart.  She is currently able to walk from the back of her house to the front and do grocery shopping at [employer] without getting short of breath. She attends therapy once a week for her leg arthritis, sings in the church choir, and goes to church weekly. She is active in these ways.  She is conscious about her diet, eating chicken, fish, fruits, and vegetables, and has been trying to lose weight by using smaller plates and being mindful of her portions. She has not been on any weight loss medications and has been to a nutritionist through her primary care.  She has had her flu and COVID vaccines, and she believes she has received the pneumonia vaccine after age 37.     Lung Health: Functional status: ET can do grocery in [place] which does not bother her breathing much.  Covid vaccine: 5 so far.  Pneumonococcal vaccine: through PCP.  Smoking: ex 3cig/d intermittently and quit in 2003.  Occupational exposure/pets: no pets. Used to have a dog. No birds. 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   Labs: BNP 103 in 2024, August 2025- 31.  Echo July 2024: Normal EF 60-65%.  Normal diastolic filling pressures.  Mild TR no evidence of pulmonary hypertension.  In 2020 had grade 1 diastolic dysfunction.  PFT: 2019: No obstructive or restrictive lung disease but moderate reduction in DLCO.   PFT October 2025: No obstruction or restriction but with positive bronchodilator response.  Improved fef 25-75 after bronchodilator.  DLCO -2.12 Z-score [moderate reduction].    Allergies:  Patient has no known allergies.  Current Outpatient Medications:    acetaminophen  (TYLENOL ) 500 MG tablet, Take 500 mg by mouth every 6 (six) hours as needed., Disp: , Rfl:    amLODipine  (NORVASC ) 5 MG tablet, Take 1 tablet (5 mg total) by mouth daily. To be taken with amlodipine  2.5 mg daily for a total of 7.5 mg daily., Disp: 90 tablet, Rfl: 3   Black Pepper-Turmeric (TURMERIC CURCUMIN) 08-998 MG CAPS,  See admin instructions., Disp: , Rfl:    losartan (COZAAR) 100 MG tablet, 100 mg. , Disp: , Rfl: 5   Multiple Vitamins-Minerals (CENTRUM SILVER  PO), Take by mouth., Disp: , Rfl:    Omega-3 Fatty Acids (FISH OIL) 1000 MG CAPS, Take by mouth., Disp: , Rfl:    pantoprazole (PROTONIX) 40 MG tablet, Take 1 tablet by mouth daily., Disp: , Rfl:    rosuvastatin  (CRESTOR ) 10 MG tablet, Take 1 tablet (10 mg total) by mouth daily. Office visit needed for future refills . Call office at [phone]. First of 3 attempts. Thank you., Disp: 60 tablet, Rfl: 0   spironolactone  (ALDACTONE ) 25 MG tablet, Take 1 tablet (25 mg total) by mouth daily., Disp: 90 tablet, Rfl: 0   budesonide -formoterol  ([name] ) 160-4.5 MCG/ACT inhaler, Inhale 2 puffs into the lungs 2 (two) times daily., Disp: 1 each, Rfl: 11   Vitamin D, Ergocalciferol, (DRISDOL) 1.25 MG (50000 UT) CAPS capsule, Take 50,000 Units by mouth every 7 (seven) days., Disp: , Rfl:  Past Medical History:  Diagnosis Date   Anemia    h/o anemia   Arthritis    GERD (gastroesophageal reflux disease)    no meds   Hyperlipidemia    borderline   Hypertension    Leg abrasion    had to go to the wound center-? diag   Shortness of breath    due to her weight- per pt   Past Surgical History:  Procedure Laterality Date   BIOPSY BREAST Left    CATARACT EXTRACTION     11/16/14 and 12/13/14 (Dr. Cleatus)   DILATATION & CURRETTAGE/HYSTEROSCOPY WITH RESECTOCOPE N/A 12/07/2013   Procedure: DILATATION & CURETTAGE/HYSTEROSCOPY WITH RESECTOCOPE;  Surgeon: Ronal Elvie Pinal, MD;  Location: [facility];  Service: Gynecology;  Laterality: N/A;   FOOT SURGERY  1/16   hamer toe repair   ROTATOR CUFF REPAIR Right 05/24/2015   TONSILLECTOMY     TUBAL LIGATION     Family History  Problem Relation Age of Onset   Liver cancer Sister    Brain cancer Father        tumor   Arthritis Mother    Colon cancer Maternal Uncle    Leukemia Maternal Uncle    Social History    Socioeconomic History   Marital status: Single    Spouse name: Not on file   Number of children: 1   Years of education: Not on file   Highest education level: Not on file  Occupational History   Not on file  Tobacco Use   Smoking status: Former    Current packs/day: 0.00    Average packs/day: 0.3 packs/day for 15.0 years (3.8 ttl pk-yrs)    Types: Cigarettes    Start date: 89    Quit date: 2003    Years since quitting: 22.8   Smokeless tobacco: Never   Tobacco comments:    years ago-not heavy/quit 2003/2004  Vaping Use   Vaping status: Never Used  Substance and Sexual Activity   Alcohol use: Yes  Alcohol/week: 0.0 - 1.0 standard drinks of alcohol    Comment: occ   Drug use: No   Sexual activity: Not Currently    Partners: Male    Birth control/protection: Post-menopausal  Other Topics Concern   Not on file  Social History Narrative   Not on file   Social Drivers of Health   Financial Resource Strain: Not on file  Food Insecurity: Not on file  Transportation Needs: Not on file  Physical Activity: Not on file  Stress: Not on file  Social Connections: Not on file  Intimate Partner Violence: Not on file       Objective:  BP 122/74   Pulse 65   Temp 97.8 F (36.6 C) (Oral)   Ht 5' 5 (1.651 m)   Wt 271 lb 12.8 oz (123.3 kg)   SpO2 98% Comment: on RA  BMI 45.23 kg/m  BMI Readings from Last 3 Encounters:  02/14/24 45.23 kg/m  12/19/23 43.90 kg/m  03/13/23 44.30 kg/m    Physical Exam   ENT: Normal mucosa. No hypertrophy of inferior turbinates.  PULMONARY: Lungs clear to auscultation bilaterally, no adventitious breath sounds. CARDIOVASCULAR: Regular rate and rhythm, S1 S2 normal, no murmurs. ABDOMEN: Abdomen soft, nontender. Bowel sounds are normal. EXTREMITIES: No peripheral edema noted.        Diagnostic Review:  Last metabolic panel No results found for: GLUCOSE, NA, K, CL, CO2, BUN, CREATININE, EGFR, CALCIUM , PHOS,  PROT, ALBUMIN, LABGLOB, AGRATIO, BILITOT, ALKPHOS, AST, ALT, ANIONGAP     Assessment & Plan:  Assessment and Plan    Mild persistent asthma Asthma with improved lung function upon inhaler use. Lung function tests rule out COPD or fibrosis. Insurance denied Symbicort ; [name]  prescribed. - Reorder [name]  inhaler prescription. - Instruct to use two puffs twice daily and rinse mouth after use to prevent thrush. - Schedule follow-up in three months to assess response. - Advise to call office if inhaler is not received or if feeling significantly better.  Dyspnea Dyspnea with low DLCO suggests possible gas exchange issues. Differential includes cardiac or pulmonary causes; lung scans and chest x-ray show no significant changes. Hemoglobin levels unavailable. - Draw blood to check hemoglobin levels. - Assess improvement with inhaler use. - Consider chest CT if no improvement with inhaler.   Obesity: - working on losing weight.  - does not want to try GLP1.  - follows dietician through PCP.     No follow-ups on file.   I personally spent a total of 25 minutes in the care of the patient today including preparing to see the patient, getting/reviewing separately obtained history, performing a medically appropriate exam/evaluation, counseling and educating, placing orders, documenting clinical information in the EHR, independently interpreting results, and communicating results.  Malachi Suderman, MD

## 2024-02-14 NOTE — Patient Instructions (Signed)
  VISIT SUMMARY: During today's visit, we discussed your issues related to your inhaler prescription and lung function. We reviewed your recent lung function test results and addressed the problems you had with obtaining your prescribed inhaler. We also talked about your current activities, diet, and recent vaccinations.  YOUR PLAN: -MILD PERSISTENT ASTHMA: Asthma is a condition where your airways become inflamed and narrow, making it hard to breathe. Your lung function improved with inhaler use, and tests ruled out other conditions like COPD or fibrosis. We have reordered the [name]  inhaler for you. Please use two puffs twice daily and rinse your mouth after each use to prevent thrush. We will follow up in three months to see how you are doing. If you do not receive the inhaler or feel significantly better, please call our office.  -DYSPNEA: Dyspnea means shortness of breath. Your low DLCO suggests there might be issues with gas exchange in your lungs. This could be due to heart or lung problems. Since your lung scans and chest x-ray did not show significant changes, we will draw blood to check your hemoglobin levels. If there is no improvement with the inhaler, we may consider a chest CT scan.  INSTRUCTIONS: Please follow up in three months to assess your response to the inhaler. If you do not receive the inhaler or feel significantly better, call our office. We will also draw blood to check your hemoglobin levels. If there is no improvement with the inhaler, we may consider a chest CT scan.                      Contains text generated by Abridge.                                 Contains text generated by Abridge.

## 2024-02-14 NOTE — Progress Notes (Signed)
 Full pft performed today

## 2024-02-14 NOTE — Patient Instructions (Signed)
 Full pft performed today

## 2024-02-17 NOTE — Progress Notes (Signed)
 Called and spoke to pt - advised of blood work results per Dr. Theodoro. Pt verbalized understanding, NFN.  Pt states that when she tried to pick up her inhaler prescription, the pharmacy was out of stock but she was assured that they should have more within the next few days. She will pick up the inhaler then.

## 2024-03-08 ENCOUNTER — Other Ambulatory Visit: Payer: Self-pay | Admitting: Cardiology

## 2024-03-08 DIAGNOSIS — E782 Mixed hyperlipidemia: Secondary | ICD-10-CM

## 2024-03-09 ENCOUNTER — Other Ambulatory Visit: Payer: Self-pay | Admitting: Family Medicine

## 2024-03-09 DIAGNOSIS — Z1231 Encounter for screening mammogram for malignant neoplasm of breast: Secondary | ICD-10-CM

## 2024-04-14 ENCOUNTER — Ambulatory Visit
Admission: RE | Admit: 2024-04-14 | Discharge: 2024-04-14 | Disposition: A | Discharge status: Home / Self Care | Source: Ambulatory Visit | Attending: Family Medicine | Admitting: Family Medicine

## 2024-04-14 DIAGNOSIS — Z1231 Encounter for screening mammogram for malignant neoplasm of breast: Secondary | ICD-10-CM

## 2024-05-09 ENCOUNTER — Other Ambulatory Visit: Payer: Self-pay | Admitting: Cardiology

## 2024-05-09 DIAGNOSIS — R0609 Other forms of dyspnea: Secondary | ICD-10-CM

## 2024-05-14 ENCOUNTER — Ambulatory Visit

## 2024-05-14 VITALS — BP 134/68 | HR 66 | Temp 97.5°F | Ht 66.0 in | Wt 265.4 lb

## 2024-05-14 DIAGNOSIS — Z6841 Body Mass Index (BMI) 40.0 and over, adult: Secondary | ICD-10-CM

## 2024-05-14 DIAGNOSIS — Z87891 Personal history of nicotine dependence: Secondary | ICD-10-CM

## 2024-05-14 DIAGNOSIS — J4530 Mild persistent asthma, uncomplicated: Secondary | ICD-10-CM

## 2024-05-14 DIAGNOSIS — R0609 Other forms of dyspnea: Secondary | ICD-10-CM | POA: Diagnosis not present

## 2024-05-14 DIAGNOSIS — E669 Obesity, unspecified: Secondary | ICD-10-CM

## 2024-05-14 NOTE — Progress Notes (Signed)
 "  New Patient Pulmonology Office Visit   Subjective:  Patient ID: Christine Munoz, female    DOB: 1940/11/16  MRN: [id number]  Referred by: Gerome Brunet, DO  CC:  Chief Complaint  Patient presents with   Asthma    SOB persistent.  Patient using generic Symbicort  160 mcg.  Does not think med is helping with SOB    Asthma Her past medical history is significant for asthma.   Christine Munoz is a 84 y.o. female with hypertension, morbid obesity, tonsillectomy.  DOE since last 1 year. A/w wheezing. Occasional cough no phlegm. No nocturnal cough. No chest tightness. No F/C/N/V. No sick contact.   ASTHMA:  First diagnosed: no formal diagnosis.  FH: sister Triggers: heavy perfumes which caused sob.  Intubated: no Last steroid use:no Treatment: not on inhaler.  Others: allergy symptoms- no significant allergy symptoms, throat choking/stridor- no, GERD- no, OSA- no  02/14/24 >breyna  2 puff BID and prn. Hb normal.  05/14/24 > inconsistent [name]  use.     Discussed the use of AI scribe software for clinical note transcription with the patient, who gave verbal consent to proceed.  History of Present Illness   Christine Munoz is an 84 year old female with mild asthma who presents for follow-up.  She experiences shortness of breath primarily when walking, which she attributes to her weight. She has not been consistent with her inhaler use, typically using it once a day at bedtime rather than twice daily as prescribed.  Between Thanksgiving and Christmas, she experienced a significant cough and lack of energy after hosting a family gathering. During this time, she was prescribed antibiotics for five days, but no specific cause was identified. She was 'down and out' for a couple of weeks but has since returned to her usual state.  Her past medical history includes a mild reduction in DLCO noted on previous pulmonary function tests. She has a history of heart blood  medication use that began in her late sixties or seventies.  She lives alone and mentions eating a diet rich in vegetables and fruits. She is concerned about her weight and experiences leg pain that limits her ability to exercise.     Lung Health: Functional status: ET can do grocery in [place] which does not bother her breathing much.  Stable breathing for many years. Covid vaccine: 5 so far.  Pneumonococcal vaccine: through PCP.  Smoking: ex 3cig/d intermittently and quit in 2003.  Occupational exposure/pets: no pets. Used to have a dog. 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   Labs: BNP 103 in 2024, August 2025- 31.  Echo July 2024: Normal EF 60-65%.  Normal diastolic filling pressures.  Mild TR no evidence of pulmonary hypertension.  In 2020 had grade 1 diastolic dysfunction.  PFT: 2019: No obstructive or restrictive lung disease but moderate reduction in DLCO.   PFT October 2025: No obstruction or restriction but with positive bronchodilator response.  Improved fef 25-75 after bronchodilator.  DLCO -2.12 Z-score [mild reduction].    Allergies: Patient has no known allergies.  Current Outpatient Medications:    acetaminophen  (TYLENOL ) 500 MG tablet, Take 500 mg by mouth every 6 (six) hours as needed., Disp: , Rfl:    amLODipine  (NORVASC ) 5 MG tablet, Take 1 tablet (5 mg total) by mouth daily. To be taken with amlodipine  2.5 mg daily for a total of 7.5 mg daily., Disp: 90 tablet, Rfl: 3   Black Pepper-Turmeric (TURMERIC CURCUMIN) 08-998 MG CAPS, See admin  instructions., Disp: , Rfl:    budesonide -formoterol  ([name] ) 160-4.5 MCG/ACT inhaler, Inhale 2 puffs into the lungs 2 (two) times daily. (Patient taking differently: Inhale 2 puffs into the lungs at bedtime.), Disp: 1 each, Rfl: 11   losartan (COZAAR) 100 MG tablet, 100 mg. , Disp: , Rfl: 5   Multiple Vitamins-Minerals (CENTRUM SILVER  PO), Take by mouth., Disp: , Rfl:    Omega-3 Fatty Acids (FISH OIL) 1000 MG CAPS, Take by  mouth., Disp: , Rfl:    pantoprazole (PROTONIX) 40 MG tablet, Take 1 tablet by mouth daily., Disp: , Rfl:    rosuvastatin  (CRESTOR ) 10 MG tablet, TAKE 1 TABLET BY MOUTH EVERY DAY, Disp: 15 tablet, Rfl: 0   spironolactone  (ALDACTONE ) 25 MG tablet, TAKE 1 TABLET (25 MG TOTAL) BY MOUTH DAILY., Disp: 30 tablet, Rfl: 0   Vitamin D, Ergocalciferol, (DRISDOL) 1.25 MG (50000 UT) CAPS capsule, Take 50,000 Units by mouth every 7 (seven) days., Disp: , Rfl:  Past Medical History:  Diagnosis Date   Anemia    h/o anemia   Arthritis    GERD (gastroesophageal reflux disease)    no meds   Hyperlipidemia    borderline   Hypertension    Leg abrasion    had to go to the wound center-? diag   Shortness of breath    due to her weight- per pt   Past Surgical History:  Procedure Laterality Date   BIOPSY BREAST Left    CATARACT EXTRACTION     11/16/14 and 12/13/14 (Dr. Cleatus)   DILATATION & CURRETTAGE/HYSTEROSCOPY WITH RESECTOCOPE N/A 12/07/2013   Procedure: DILATATION & CURETTAGE/HYSTEROSCOPY WITH RESECTOCOPE;  Surgeon: Ronal Elvie Pinal, MD;  Location: [facility];  Service: Gynecology;  Laterality: N/A;   FOOT SURGERY  1/16   hamer toe repair   ROTATOR CUFF REPAIR Right 05/24/2015   TONSILLECTOMY     TUBAL LIGATION     Family History  Problem Relation Age of Onset   Liver cancer Sister    Brain cancer Father        tumor   Arthritis Mother    Colon cancer Maternal Uncle    Leukemia Maternal Uncle    Social History   Socioeconomic History   Marital status: Single    Spouse name: Not on file   Number of children: 1   Years of education: Not on file   Highest education level: Not on file  Occupational History   Not on file  Tobacco Use   Smoking status: Former    Current packs/day: 0.00    Average packs/day: 0.3 packs/day for 15.0 years (3.8 ttl pk-yrs)    Types: Cigarettes    Start date: 89    Quit date: 2003    Years since quitting: 23.0   Smokeless tobacco: Never   Tobacco  comments:    years ago-not heavy/quit 2003/2004  Vaping Use   Vaping status: Never Used  Substance and Sexual Activity   Alcohol use: Yes    Alcohol/week: 0.0 - 1.0 standard drinks of alcohol    Comment: occ   Drug use: No   Sexual activity: Not Currently    Partners: Male    Birth control/protection: Post-menopausal  Other Topics Concern   Not on file  Social History Narrative   Not on file   Social Drivers of Health   Tobacco Use: Medium Risk (05/14/2024)   Patient History    Smoking Tobacco Use: Former    Smokeless Tobacco Use: Never  Passive Exposure: Not on file  Financial Resource Strain: Not on file  Food Insecurity: Not on file  Transportation Needs: Not on file  Physical Activity: Not on file  Stress: Not on file  Social Connections: Not on file  Intimate Partner Violence: Not on file  Depression (PHQ2-9): Low Risk (03/13/2023)   Depression (PHQ2-9)    PHQ-2 Score: 0  Alcohol Screen: Not on file  Housing: Not on file  Utilities: Not on file  Health Literacy: Not on file       Objective:  BP 134/68   Pulse 66   Temp (!) 97.5 F (36.4 C) (Temporal)   Ht 5' 6 (1.676 m)   Wt 265 lb 6.4 oz (120.4 kg)   SpO2 99% Comment: room air  BMI 42.84 kg/m  BMI Readings from Last 3 Encounters:  05/14/24 42.84 kg/m  02/14/24 45.23 kg/m  12/19/23 43.90 kg/m    Physical Exam   ENT: Normal mucosa. No hypertrophy of inferior turbinates.  PULMONARY: Lungs clear to auscultation bilaterally, no adventitious breath sounds. CARDIOVASCULAR: Regular rate and rhythm, S1 S2 normal, no murmurs. ABDOMEN: Abdomen soft, nontender. Bowel sounds are normal. EXTREMITIES: No peripheral edema noted.        Diagnostic Review:  Last metabolic panel No results found for: GLUCOSE, NA, K, CL, CO2, BUN, CREATININE, EGFR, CALCIUM , PHOS, PROT, ALBUMIN, LABGLOB, AGRATIO, BILITOT, ALKPHOS, AST, ALT, ANIONGAP     Assessment & Plan:  Assessment  and Plan    Mild persistent asthma with exertional dyspnea and mild reduction in DLCO Differential includes cardiac or pulmonary causes, but reduction is mild. Symptoms stable for years. Recent cough and fatigue resolved with antibiotics. Inconsistent inhaler use may affect control. - Encouraged consistent inhaler use twice daily. - Educated on proper inhaler technique and storage. - Reassess breathing symptoms in six months. - Consider echocardiogram or CT chest if symptoms worsen.   Obesity: - working on losing weight.  - does not want to try GLP1.  - follows dietician through PCP.     Return in about 6 months (around 11/11/2024).   I personally spent a total of 20 minutes in the care of the patient today including preparing to see the patient, getting/reviewing separately obtained history, performing a medically appropriate exam/evaluation, counseling and educating, placing orders, documenting clinical information in the EHR, independently interpreting results, and communicating results.  Sammi Fredericks, MD   "

## 2024-05-14 NOTE — Patient Instructions (Signed)
" °  VISIT SUMMARY: During your visit, we discussed your mild asthma and the importance of consistent inhaler use. We also reviewed your recent cough and fatigue, which have since resolved.  YOUR PLAN: -MILD PERSISTENT ASTHMA WITH EXERTIONAL DYSPNEA AND MILD REDUCTION IN DLCO: Mild persistent asthma means you have ongoing asthma symptoms that can affect your breathing, especially during physical activity. The mild reduction in DLCO indicates a slight resistance to oxygen transfer in your lungs. We discussed the importance of using your inhaler twice daily as prescribed to help manage your symptoms. I also provided education on proper inhaler technique and storage. We will reassess your breathing symptoms in six months. If your symptoms worsen, we may consider further tests such as an echocardiogram or a CT scan of your chest.  INSTRUCTIONS: Please remember to use your inhaler twice daily as prescribed. We will reassess your breathing symptoms in six months. If your symptoms worsen before then, please contact the office as we may need to consider further tests such as an echocardiogram or a CT scan of your chest.    Contains text generated by Abridge.   "
